# Patient Record
Sex: Female | Born: 1985
Health system: Southern US, Community
[De-identification: ages and names within clinical notes are randomized; demographics above are authoritative.]

## PROBLEM LIST (undated history)

## (undated) DIAGNOSIS — E119 Type 2 diabetes mellitus without complications: Secondary | ICD-10-CM

## (undated) DIAGNOSIS — R519 Headache, unspecified: Secondary | ICD-10-CM

## (undated) DIAGNOSIS — R87629 Unspecified abnormal cytological findings in specimens from vagina: Secondary | ICD-10-CM

## (undated) DIAGNOSIS — C801 Malignant (primary) neoplasm, unspecified: Secondary | ICD-10-CM

## (undated) HISTORY — DX: Unspecified abnormal cytological findings in specimens from vagina: R87.629

## (undated) HISTORY — PX: LEEP: SHX91

## (undated) HISTORY — PX: WISDOM TOOTH EXTRACTION: SHX21

## (undated) HISTORY — DX: Headache, unspecified: R51.9

---
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
## 2006-06-07 ENCOUNTER — Emergency Department (HOSPITAL_COMMUNITY): Admission: EM | Admit: 2006-06-07 | Discharge: 2006-06-08 | Payer: Self-pay | Admitting: Emergency Medicine

## 2006-07-04 ENCOUNTER — Other Ambulatory Visit: Admission: RE | Admit: 2006-07-04 | Discharge: 2006-07-04 | Payer: Self-pay | Admitting: Obstetrics and Gynecology

## 2006-12-30 ENCOUNTER — Ambulatory Visit (HOSPITAL_COMMUNITY): Admission: RE | Admit: 2006-12-30 | Discharge: 2006-12-30 | Payer: Self-pay | Admitting: Obstetrics and Gynecology

## 2007-03-04 ENCOUNTER — Inpatient Hospital Stay (HOSPITAL_COMMUNITY): Admission: AD | Admit: 2007-03-04 | Discharge: 2007-03-07 | Payer: Self-pay | Admitting: Obstetrics and Gynecology

## 2008-08-17 ENCOUNTER — Emergency Department (HOSPITAL_COMMUNITY): Admission: EM | Admit: 2008-08-17 | Discharge: 2008-08-17 | Payer: Self-pay | Admitting: Emergency Medicine

## 2008-08-19 ENCOUNTER — Emergency Department (HOSPITAL_COMMUNITY): Admission: EM | Admit: 2008-08-19 | Discharge: 2008-08-19 | Payer: Self-pay | Admitting: Emergency Medicine

## 2009-03-15 ENCOUNTER — Emergency Department (HOSPITAL_COMMUNITY): Admission: EM | Admit: 2009-03-15 | Discharge: 2009-03-15 | Payer: Self-pay | Admitting: Emergency Medicine

## 2010-05-13 IMAGING — CR DG CHEST 2V
2 series · 2 of 2 positions shown · non-contrast
Comparison: None

CLINICAL DATA: Sore throat and cough

CHEST - 2 VIEW

[w chest pa]
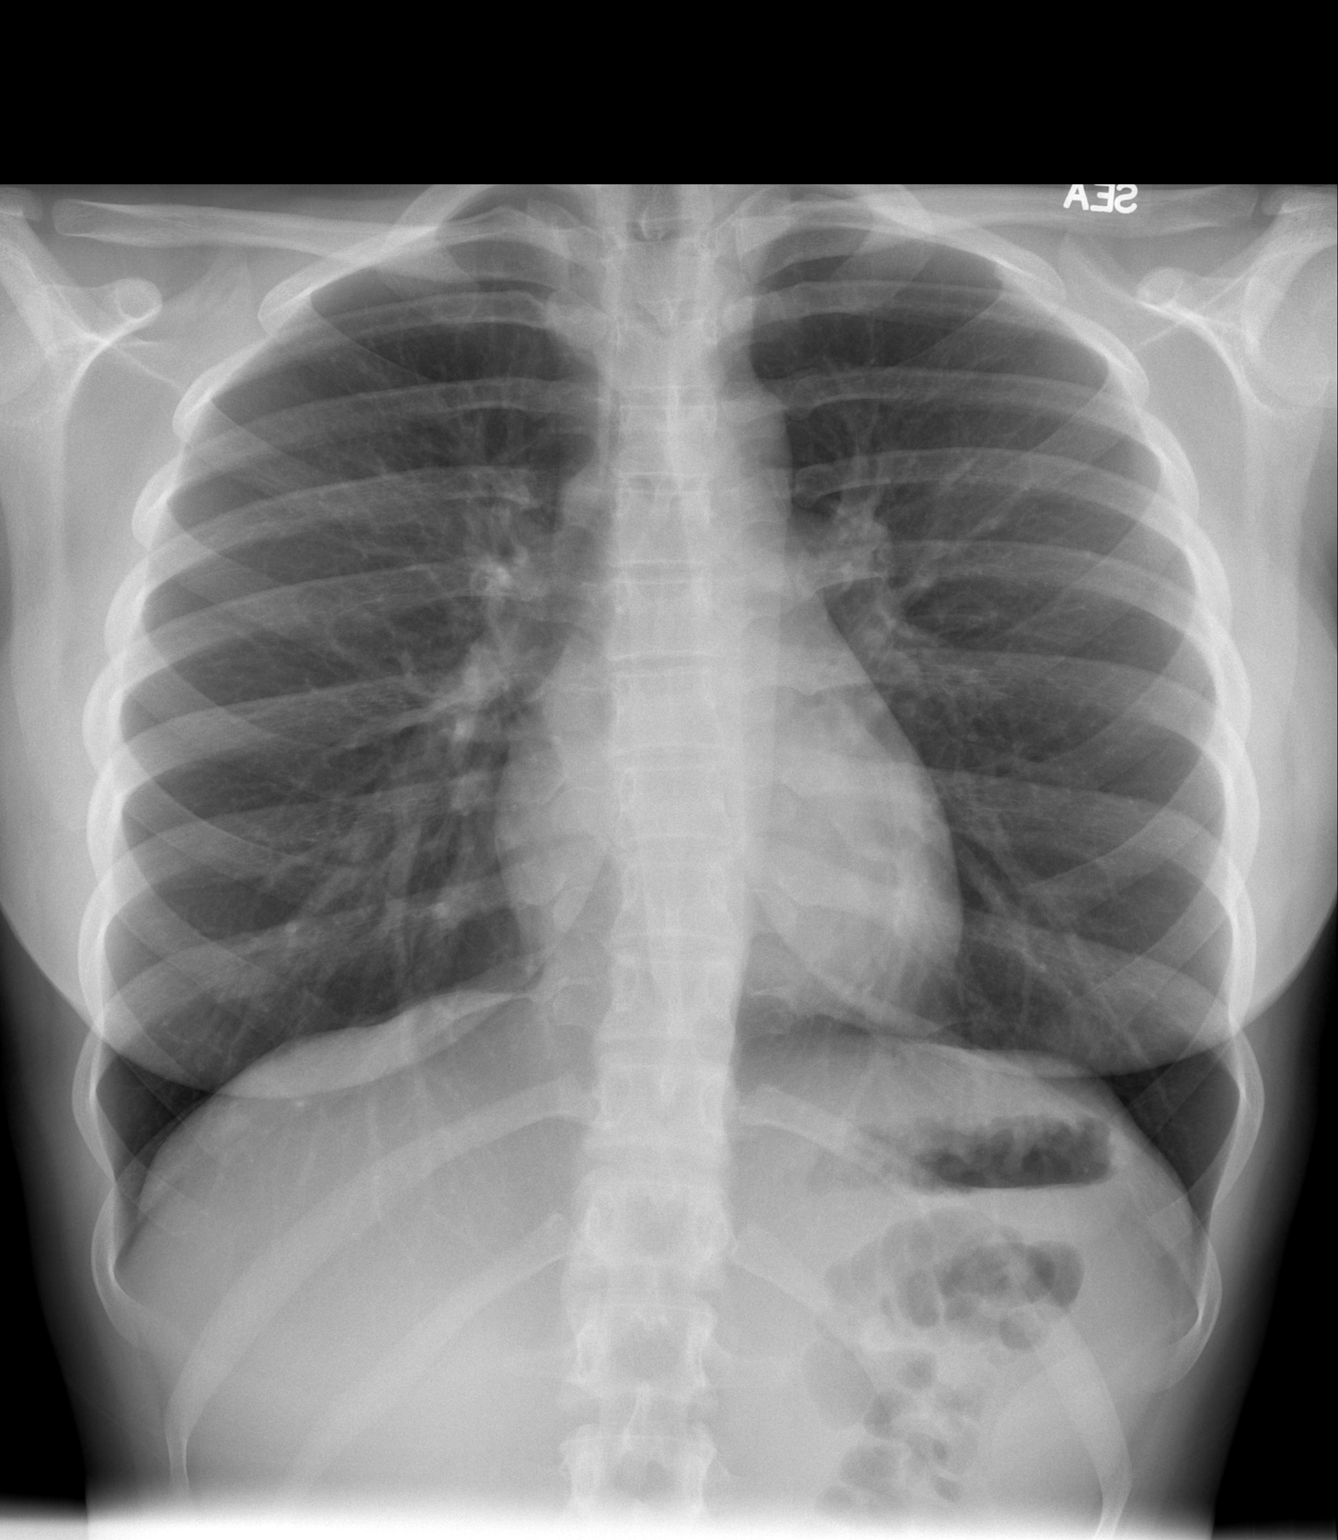

[w chest lat]
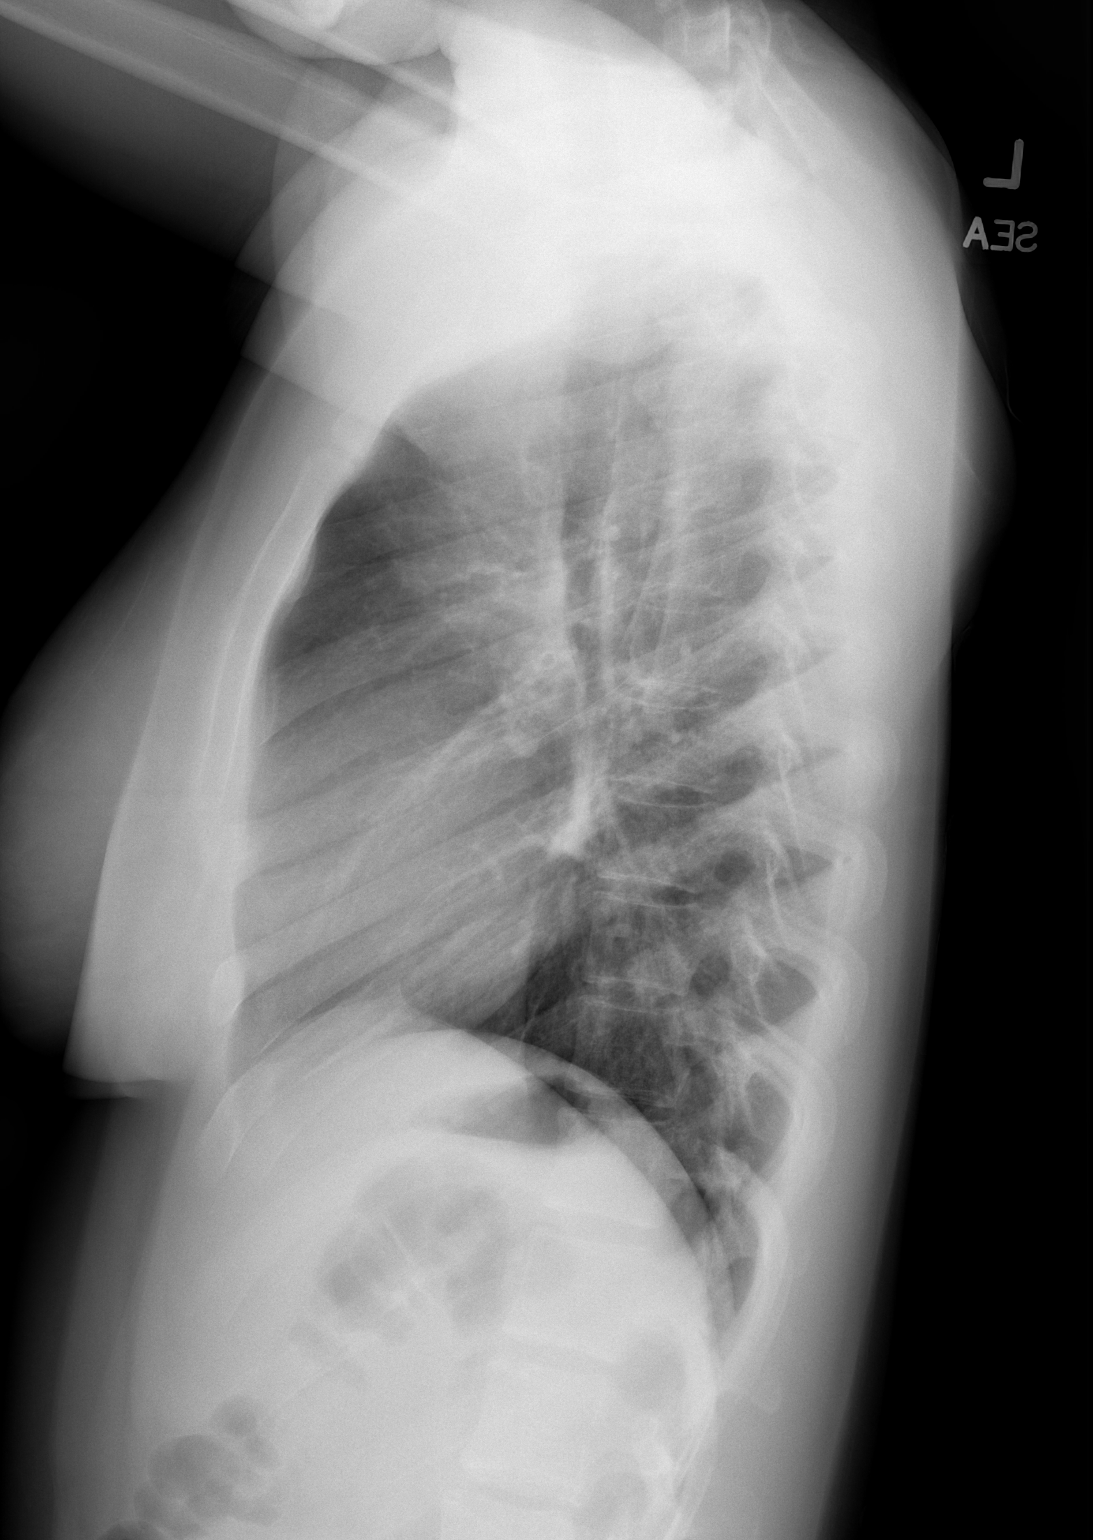

[2 of 2 positions shown; findings below may reference images not displayed]

FINDINGS: Heart and mediastinal contours are normal.  Both lungs
are well expanded and clear.  No adenopathy or pleural fluid is
identified.  Osseous structures unremarkable.
IMPRESSION: No acute findings in the chest.

## 2011-03-03 LAB — STREP A DNA PROBE

## 2011-03-03 LAB — RAPID STREP SCREEN (MED CTR MEBANE ONLY): Streptococcus, Group A Screen (Direct): NEGATIVE

## 2011-04-09 NOTE — Discharge Summary (Signed)
Heather Charles, Heather Charles                ACCOUNT NO.:  [id number]   MEDICAL RECORD NO.:  [id number]          PATIENT TYPE:  INP   LOCATION:  9110                          FACILITY:  [facility]   PHYSICIAN:  Sherron Monday, MD        DATE OF BIRTH:  December 24, 1985   DATE OF ADMISSION:  03/04/2007  DATE OF DISCHARGE:  03/07/2007                               DISCHARGE SUMMARY   ADMISSION DIAGNOSIS:  Intrauterine pregnancy at term, early labor.   DISCHARGE DIAGNOSES:  Intrauterine pregnancy at term, early labor,  delivered via spontaneous vaginal delivery.   HISTORY OF PRESENT ILLNESS:  A 25 year old G1 at 37-0 weeks with  decreased fetal movement, no loss of fluid, no vaginal bleeding but  regular painful contractions approximately every 3-5 minutes for the  past several hours. Her prenatal care was complicated by the pregnancy  measuring size less than dates; however, she had normal growth by  ultrasound.  Her EDC is 03/25/07.   PAST MEDICAL HISTORY:  Not significant.   PAST SURGICAL HISTORY:  Significant for LEEP.   OB/GYN HISTORY:  G1 is the present pregnancy with a history of an  abnormal pap smear and a LEEP procedure in the past.  Her pap smears  have been normal since.  Sexually transmitted disease, she has HPV.   MEDICATIONS:  Prenatal vitamins.   ALLERGIES:  No known drug allergies.   SOCIAL HISTORY:  Negative x3. She previously was a smoker but quit with  the pregnancy. She is single.   FAMILY HISTORY:  Significant for coronary artery disease in maternal  grandfather, high blood pressure in paternal grandfather, COPD in the  maternal great aunt and cancer of unknown origin in a maternal great  uncle.   PRENATAL LABORATORY DATA:  Hemoglobin 13.4, platelet count 203,000,  blood type O negative, antibody screen negative, gonorrhea negative,  chlamydia negative, RPR nonreactive, rubella immune.  Cystic fibrosis  negative, hepatitis B surface antigen negative, HIV nonreactive, Glucola  115, group B streptococcus was negative. Ultrasound performed on  11/05/07 giving EDC of 03/25/07 revealed normal anatomy of a female infant,  no posterior placenta.   ADMISSION PHYSICAL EXAMINATION:  VITAL SIGNS:  She is afebrile with  stable vital signs.  GENERAL:  She was uncomfortable, otherwise, had a benign examination.   HOSPITAL COURSE:  Fetal heart tones were in the 150s and reactive.  Contractions were approximately every three minutes.  A sterile vaginal  examination was 2-3, 70 and -2, which had been 1 in the office earlier  in the week. She was admitted in early labor. She was group B  streptococcus negative, so no prophylaxis was necessary.  A spontaneous  vaginal delivery was anticipated. Her labor course was relatively  uncomplicated. She was ruptured for clear fluid, 3-4 cm, 90% effaced and  -1 station with a temperature that was 100.6. She was started on  ampicillin and gentamicin for chorioamnionitis but at this time she is 9  cm, 100% effaced and +1 station. She proceeded on to be complete +1 and  pushed for approximately two hours. She delivered  a viable female infant  at 8:42 a.m. with Apgars of 7 at one minute and 8 at five minutes,  weighed 6 pounds 7 ounces. The infant was delivered OP to ROT.  Placenta  was expressed intact. There was a true knot in the cord, no OB  laceration, EBL was less than 500 cc.  Discussed with the patient  circumcision for the female infant including risks, benefits and  alternatives.  They wished to proceed with this.  Her postpartum course  was uncomplicated. She remained afebrile with vital signs stable  throughout. Her hemoglobin decreased from 12.2 to 10.1.  On postpartum  day two she was discharged to home with routine discharge instructions  including numbers to call with any questions or problems and  prescriptions for Vicodin, Motrin and prenatal vitamins.   DISCHARGE INFORMATION:  She is O negative, rubella immune. Bottle  feeding  and wants to get Mirena for contraception.  We will check her  benefits for this.  The infant was O positive blood type so she will  receive RhoGAM prior to discharge.      Sherron Monday, MD  Electronically Signed     JB/MEDQ  D:  03/07/2007  T:  03/07/2007  Job:  [id number]

## 2012-02-16 ENCOUNTER — Emergency Department (HOSPITAL_COMMUNITY): Payer: Medicaid Other

## 2012-02-16 ENCOUNTER — Emergency Department (HOSPITAL_COMMUNITY)
Admission: EM | Admit: 2012-02-16 | Discharge: 2012-02-16 | Disposition: A | Discharge status: Home / Self Care | Payer: Medicaid Other | Attending: Emergency Medicine | Admitting: Emergency Medicine

## 2012-02-16 ENCOUNTER — Encounter (HOSPITAL_COMMUNITY): Payer: Self-pay | Admitting: *Deleted

## 2012-02-16 DIAGNOSIS — F17200 Nicotine dependence, unspecified, uncomplicated: Secondary | ICD-10-CM | POA: Insufficient documentation

## 2012-02-16 DIAGNOSIS — R059 Cough, unspecified: Secondary | ICD-10-CM | POA: Insufficient documentation

## 2012-02-16 DIAGNOSIS — J40 Bronchitis, not specified as acute or chronic: Secondary | ICD-10-CM

## 2012-02-16 DIAGNOSIS — R071 Chest pain on breathing: Secondary | ICD-10-CM | POA: Insufficient documentation

## 2012-02-16 DIAGNOSIS — Z8541 Personal history of malignant neoplasm of cervix uteri: Secondary | ICD-10-CM | POA: Insufficient documentation

## 2012-02-16 DIAGNOSIS — R05 Cough: Secondary | ICD-10-CM | POA: Insufficient documentation

## 2012-02-16 HISTORY — DX: Malignant (primary) neoplasm, unspecified: C80.1

## 2012-02-16 MED ORDER — HYDROCOD POLST-CHLORPHEN POLST 10-8 MG/5ML PO LQCR: 5.0000 mL | Freq: Two times a day (BID) | ORAL | Status: DC | PRN

## 2012-02-16 MED ORDER — AZITHROMYCIN 250 MG PO TABS: 500.0000 mg | ORAL_TABLET | Freq: Every day | ORAL | Status: AC

## 2012-02-16 NOTE — ED Notes (Signed)
Pt is here with coughing since feburary and sts that right side rib area is hurting with coughing

## 2012-02-16 NOTE — Discharge Instructions (Signed)
Bronchitis Bronchitis is the body's way of reacting to injury and/or infection (inflammation) of the bronchi. Bronchi are the air tubes that extend from the windpipe into the lungs. If the inflammation becomes severe, it may cause shortness of breath. CAUSES  Inflammation may be caused by:  A virus.   Germs (bacteria).   Dust.   Allergens.   Pollutants and many other irritants.  The cells lining the bronchial tree are covered with tiny hairs (cilia). These constantly beat upward, away from the lungs, toward the mouth. This keeps the lungs free of pollutants. When these cells become too irritated and are unable to do their job, mucus begins to develop. This causes the characteristic cough of bronchitis. The cough clears the lungs when the cilia are unable to do their job. Without either of these protective mechanisms, the mucus would settle in the lungs. Then you would develop pneumonia. Smoking is a common cause of bronchitis and can contribute to pneumonia. Stopping this habit is the single most important thing you can do to help yourself. TREATMENT   Your caregiver may prescribe an antibiotic if the cough is caused by bacteria. Also, medicines that open up your airways make it easier to breathe. Your caregiver may also recommend or prescribe an expectorant. It will loosen the mucus to be coughed up. Only take over-the-counter or prescription medicines for pain, discomfort, or fever as directed by your caregiver.   Removing whatever causes the problem (smoking, for example) is critical to preventing the problem from getting worse.   Cough suppressants may be prescribed for relief of cough symptoms.   Inhaled medicines may be prescribed to help with symptoms now and to help prevent problems from returning.   For those with recurrent (chronic) bronchitis, there may be a need for steroid medicines.  SEEK IMMEDIATE MEDICAL CARE IF:   During treatment, you develop more pus-like mucus  (purulent sputum).   You have a fever.   Your baby is older than 3 months with a rectal temperature of 102 F (38.9 C) or higher.   Your baby is 60 months old or younger with a rectal temperature of 100.4 F (38 C) or higher.   You become progressively more ill.   You have increased difficulty breathing, wheezing, or shortness of breath.  It is necessary to seek immediate medical care if you are elderly or sick from any other disease. MAKE SURE YOU:   Understand these instructions.   Will watch your condition.   Will get help right away if you are not doing well or get worse.  Document Released: 11/08/2005 Document Revised: 10/28/2011 Document Reviewed: 09/17/2008 [facility] Patient Information 2012 [place], [place].   RESOURCE GUIDE  Dental Problems  Patients with Medicaid: [facility]                     [phone] [address]                                           Phone:  [phone]                                                  If unable to pay or uninsured, contact:  [facility]  or [facility]. to become qualified for the adult dental clinic.  Chronic Pain Problems Contact [facility]  [phone] Patients need to be referred by their primary care doctor.  Insufficient Money for Medicine Contact United Way:  call "211" or [facility] [phone].  No Primary Care Doctor Call [facility]  [phone] Other agencies that provide inexpensive medical care    [facility]  [phone]    [facility]  [phone]    [facility]  [phone]    [facility]  [phone]    [facility]  8120692337    [facility]  [phone]  Substance Abuse Resources [facility] Services  [phone] [facility] [phone] [facility] [phone] Floydene Flock [phone] Residential & Outpatient Substance Abuse Program  [phone]  Psychological  Services [facility]  [phone] [facility]  [phone] [facility]   [phone] (emergency services [phone])  Abuse/Neglect [facility] Child Abuse Hotline 445-185-0040 [facility] Child Abuse Hotline [phone] (After Hours)  Emergency Shelter [facility] [phone]  Maternity Homes [facility] 717-374-1748 [facility] 4065925309  MRSA Hotline #:   601-232-8315    [facility] Resources  [facility]  [employer]                           [facility] [address] [place]                     [address]         [address]  Blondell Reveal Phone:  [phone]                                  Phone:  [phone]                   Phone:  [phone]  [facility] Phone:  [phone]  [facility] Child Abuse Hotline 7756680212 [phone] (After Hours)

## 2012-02-16 NOTE — ED Provider Notes (Signed)
History     CSN: [id number]  Arrival date & time 02/16/12  1301   First MD Initiated Contact with Patient 02/16/12 1552      Chief Complaint  Patient presents with  . Cough    (Consider location/radiation/quality/duration/timing/severity/associated sxs/prior treatment) HPI  26 year old female presents with a chief complaints of cough. Patient states she has been having a cough with yellow sputum for more than a month. Cough has been gradually getting worse. Initially it was only at night but now it has been throughout the day. Cough minimally improves with NyQuil and with cough medication. She also endorsed chest wall pain from coughing. Pain is to rib cage, more specifically to the right ribs. Denies swelling or rash, denies trauma.  Patient is a smoker but has decreased her usage to about 3 cigarettes a day. Patient denies sneezing, runny nose, sore throat, fever, nausea, vomiting, diarrhea, abdominal pain, or rash. She denies calf tenderness or leg swelling. She uses for Mirena IUD but denies any exogenous hormone, recent surgery, or prolonged bed rest.  Has prior hx of cervical cancer diagnosed >10 yrs ago.  Past Medical History  Diagnosis Date  . Cancer     cevical laser surgery    History reviewed. No pertinent past surgical history.  No family history on file.  History  Substance Use Topics  . Smoking status: Current Everyday Smoker  . Smokeless tobacco: Not on file  . Alcohol Use: No    OB History    Grav Para Term Preterm Abortions TAB SAB Ect Mult Living                  Review of Systems  All other systems reviewed and are negative.    Allergies  Review of patient's allergies indicates no known allergies.  Home Medications  No current outpatient prescriptions on file.  BP 110/73  Pulse 91  Temp(Src) 97.8 F (36.6 C) (Oral)  Resp 18  SpO2 98%  LMP 02/03/2012  Physical Exam  Nursing note and vitals reviewed. Constitutional: She appears  well-developed and well-nourished. No distress.       Awake, alert, nontoxic appearance  HENT:  Head: Atraumatic.  Mouth/Throat: Oropharynx is clear and moist. No oropharyngeal exudate.  Eyes: Conjunctivae are normal. Right eye exhibits no discharge. Left eye exhibits no discharge.  Neck: Neck supple.  Cardiovascular: Normal rate and regular rhythm.   Pulmonary/Chest: Effort normal and breath sounds normal. No respiratory distress. She exhibits no tenderness.  Abdominal: Soft. There is no tenderness. There is no rebound.  Musculoskeletal: She exhibits no tenderness.       ROM appears intact, no obvious focal weakness  Neurological: She is alert.       Mental status and motor strength appears intact  Skin: No rash noted.  Psychiatric: She has a normal mood and affect.    ED Course  Procedures (including critical care time)  Labs Reviewed - No data to display Dg Chest 2 View  02/16/2012  *RADIOLOGY REPORT*  Clinical Data: Cough.  CHEST - 2 VIEW  Comparison: 03/15/2009.  Findings: The cardiac silhouette, mediastinal and hilar contours are within normal limits and stable. The lungs are clear. Mild peribronchial thickening may suggest bronchitis or changes related to smoking.  No pleural effusions.  The bony thorax is intact.  IMPRESSION:  1.  Mild peribronchial thickening could be due to bronchitis or smoking. 2.  No infiltrates or effusions.  Original Report Authenticated By: P. Loralie Champagne, M.D.  Dg Ribs Unilateral W/chest Right  02/16/2012  *RADIOLOGY REPORT*  Clinical Data: Cough.  Right rib pain.  RIGHT RIBS AND CHEST - 3+ VIEW  Comparison: Chest x-ray 02/16/2012.  Findings: The cardiac silhouette, mediastinal and hilar contours are normal.  The right lung is clear.  No acute rib abnormality. No pneumothorax or pleural thickening.  IMPRESSION: No acute rib abnormality.  Original Report Authenticated By: P. Loralie Champagne, M.D.     No diagnosis found.    MDM  Persisting cough,  in a smoker. Chest x-ray shows some evidence suggestive of bronchitis. Due to the duration, shoulder prescribed Z-Pak, and cough medication. I have low suspicions for PE. Patient is in no acute distress with stable vital signs, and is afebrile. Smoking cessation discussed.        Fayrene Helper, PA-C 02/16/12 1605

## 2012-02-23 NOTE — ED Provider Notes (Signed)
Medical screening examination/treatment/procedure(s) were performed by non-physician practitioner and as supervising physician I was immediately available for consultation/collaboration.  Doreen Garretson, MD 02/23/12 2348 

## 2016-10-21 ENCOUNTER — Emergency Department (HOSPITAL_BASED_OUTPATIENT_CLINIC_OR_DEPARTMENT_OTHER)
Admission: EM | Admit: 2016-10-21 | Discharge: 2016-10-21 | Disposition: A | Discharge status: Home / Self Care | Payer: Medicaid Other | Attending: Physician Assistant | Admitting: Physician Assistant

## 2016-10-21 ENCOUNTER — Encounter (HOSPITAL_BASED_OUTPATIENT_CLINIC_OR_DEPARTMENT_OTHER): Payer: Self-pay | Admitting: *Deleted

## 2016-10-21 DIAGNOSIS — Y92410 Unspecified street and highway as the place of occurrence of the external cause: Secondary | ICD-10-CM | POA: Insufficient documentation

## 2016-10-21 DIAGNOSIS — Y999 Unspecified external cause status: Secondary | ICD-10-CM | POA: Insufficient documentation

## 2016-10-21 DIAGNOSIS — Y939 Activity, unspecified: Secondary | ICD-10-CM | POA: Insufficient documentation

## 2016-10-21 DIAGNOSIS — S39012A Strain of muscle, fascia and tendon of lower back, initial encounter: Secondary | ICD-10-CM | POA: Insufficient documentation

## 2016-10-21 DIAGNOSIS — F17200 Nicotine dependence, unspecified, uncomplicated: Secondary | ICD-10-CM | POA: Insufficient documentation

## 2016-10-21 DIAGNOSIS — Z859 Personal history of malignant neoplasm, unspecified: Secondary | ICD-10-CM | POA: Insufficient documentation

## 2016-10-21 DIAGNOSIS — S060X0A Concussion without loss of consciousness, initial encounter: Secondary | ICD-10-CM | POA: Insufficient documentation

## 2016-10-21 DIAGNOSIS — S161XXA Strain of muscle, fascia and tendon at neck level, initial encounter: Secondary | ICD-10-CM | POA: Insufficient documentation

## 2016-10-21 MED ORDER — CYCLOBENZAPRINE HCL 10 MG PO TABS: 10.0000 mg | ORAL_TABLET | Freq: Two times a day (BID) | ORAL | 0 refills | Status: DC | PRN

## 2016-10-21 MED ORDER — HYDROCODONE-ACETAMINOPHEN 5-325 MG PO TABS: 1.0000 | ORAL_TABLET | Freq: Four times a day (QID) | ORAL | 0 refills | Status: DC | PRN

## 2016-10-21 MED FILL — CYCLOBENZAPRINE 10 MG TAB: 10 | 10 days supply | Qty: 20 | Fill #0

## 2016-10-21 MED FILL — HYDROCODON-APAP 5-325: 5-325 | 1 days supply | Qty: 6 | Fill #0

## 2016-10-21 NOTE — ED Notes (Signed)
Signature box is not active. Written and verbal instructions given. She does not have questions. Will return for Rx after she gets cash.
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
## 2018-05-22 ENCOUNTER — Encounter: Payer: Self-pay | Admitting: Neurology

## 2018-07-18 NOTE — Progress Notes (Signed)
NEUROLOGY CONSULTATION NOTE  Heather Charles MRN: [id number] DOB: May 08, 1986  Referring provider: Maude Leriche, PA Primary care provider: Maude Leriche, PA  Reason for consult:  headache  HISTORY OF PRESENT ILLNESS: Heather Charles is a 33 year old right-handed female who presents for headache.  History supplemented by referring provider's note.  Onset:  Since childhood-adolescence Location:  Varies:  Left sided face/head, skullcap, occipital, diffuse Quality:  Pressure, sometimes throbbing Intensity:  Moderate to severe.  She denies new headache, thunderclap headache or severe headache that wakes her from sleep. Aura:  no Prodrome:  no Postdrome: no Associated symptoms:  Blurred vision, nausea, photophobia, some phonophobia.  She denies associated unilateral numbness or weakness. Duration:  When treated, it resolves for 2 hours and returns.  Usually several hours 3 times daily Frequency:  daily Frequency of abortive medication: ibuprofen almost daily, Equate Migraine once every 2 weeks Triggers:  No Exacerbating factors:  Light, computer screen, cell phone screen, sunlight Relieving factors:  Applying pressure to the pain Activity:  unsure  Current NSAIDS:  Ibuprofen 400mg  Current analgesics:  Acetaminophen 250mg -ASA 250mg -caffeine 65mg  Current triptans:  no Current ergotamine:  no Current anti-emetic:  no Current muscle relaxants:  no Current anti-anxiolytic:  no Current sleep aide:  no Current Antihypertensive medications:  no Current Antidepressant medications:  no Current Anticonvulsant medications:  no Current anti-CGRP:  no Current Vitamins/Herbal/Supplements:  no Current Antihistamines/Decongestants:  no Other therapy:  no Hormone/Birth Control:  Nexplanon  Past NSAIDS:  Aleve Past analgesics:  Tylenol Past abortive triptans:  no Past abortive ergotamine:  no Past muscle relaxants:  no Past anti-emetic:  no Past antihypertensive medications:   no Past antidepressant medications:  no Past anticonvulsant medications:  no Past anti-CGRP:  no Past vitamins/Herbal/Supplements:  no Past antihistamines/decongestants:  no Other past therapies:  no  Caffeine:  no Alcohol:  On occasion Smoker:  no Diet:  Drinks 8 16 oz bottles water daily Exercise:  Walks frequently but not specifically routine exercise Depression:  no; Anxiety:  maybe Other pain:  Back pain Sleep hygiene:  7.5 to 8 hours Family history of headache:  Mother, grandmother  05/19/18 LABS:  CBC with WBC 10.7, HGB 14.2, HCT 41.8, PLT 239; CMP with Na 138, K 4.3, glucose 85, BUN 14, Cr 0.82, t bili 0.4, ALP 60, AST 32, ALT 38.  PAST MEDICAL HISTORY: Past Medical History:  Diagnosis Date  . Cancer [facility])    cevical laser surgery    PAST SURGICAL HISTORY: LEEP  MEDICATIONS: Current Outpatient Medications on File Prior to Visit  Medication Sig Dispense Refill  . chlorpheniramine-HYDROcodone (TUSSIONEX PENNKINETIC ER) 10-8 MG/5ML LQCR Take 5 mLs by mouth every 12 (twelve) hours as needed. 140 mL 0  . cyclobenzaprine (FLEXERIL) 10 MG tablet Take 1 tablet (10 mg total) by mouth 2 (two) times daily as needed for muscle spasms. 20 tablet 0  . HYDROcodone-acetaminophen (NORCO/VICODIN) 5-325 MG tablet Take 1-2 tablets by mouth every 6 (six) hours as needed. 6 tablet 0   No current facility-administered medications on file prior to visit.     ALLERGIES: No Known Allergies  FAMILY HISTORY: Paternal grandfather:  MI, HTN Paternal Grandmother:  MI, HTN, diabetes, breast cancer Maternal grandfather:  MI, HTN Great grandmother:  Dementia  SOCIAL HISTORY: Social History   Socioeconomic History  . Marital status: Single    Spouse name: Not on file  . Number of children: Not on file  . Years of education: Not on file  .  Highest education level: Not on file  Occupational History  . Not on file  Social Needs  . Financial resource strain: Not on file  . Food  insecurity:    Worry: Not on file    Inability: Not on file  . Transportation needs:    Medical: Not on file    Non-medical: Not on file  Tobacco Use  . Smoking status: Current Every Day Smoker  . Smokeless tobacco: Never Used  Substance and Sexual Activity  . Alcohol use: No  . Drug use: No  . Sexual activity: Not on file  Lifestyle  . Physical activity:    Days per week: Not on file    Minutes per session: Not on file  . Stress: Not on file  Relationships  . Social connections:    Talks on phone: Not on file    Gets together: Not on file    Attends religious service: Not on file    Active member of club or organization: Not on file    Attends meetings of clubs or organizations: Not on file    Relationship status: Not on file  . Intimate partner violence:    Fear of current or ex partner: Not on file    Emotionally abused: Not on file    Physically abused: Not on file    Forced sexual activity: Not on file  Other Topics Concern  . Not on file  Social History Narrative  . Not on file    REVIEW OF SYSTEMS: Constitutional: No fevers, chills, or sweats, no generalized fatigue, change in appetite Eyes: No visual changes, double vision, eye pain Ear, nose and throat: No hearing loss, ear pain, nasal congestion, sore throat Cardiovascular: No chest pain, palpitations Respiratory:  No shortness of breath at rest or with exertion, wheezes GastrointestinaI: No nausea, vomiting, diarrhea, abdominal pain, fecal incontinence Genitourinary:  No dysuria, urinary retention or frequency Musculoskeletal:  No neck pain, back pain Integumentary: No rash, pruritus, skin lesions Neurological: as above Psychiatric: No depression, insomnia, anxiety Endocrine: No palpitations, fatigue, diaphoresis, mood swings, change in appetite, change in weight, increased thirst Hematologic/Lymphatic:  No purpura, petechiae. Allergic/Immunologic: no itchy/runny eyes, nasal congestion, recent allergic  reactions, rashes  PHYSICAL EXAM: Blood pressure 102/70, pulse 66, temperature 98.3 F (36.8 C), height 5' 0.25" (1.53 m), weight 143 lb (64.9 kg). General: No acute distress.  Patient appears well-groomed.   Head:  Normocephalic/atraumatic Eyes:  fundi examined but not visualized Neck: supple, no paraspinal tenderness, full range of motion Back: No paraspinal tenderness Heart: regular rate and rhythm Lungs: Clear to auscultation bilaterally. Vascular: No carotid bruits. Neurological Exam: Mental status: alert and oriented to person, place, and time, recent and remote memory intact, fund of knowledge intact, attention and concentration intact, speech fluent and not dysarthric, language intact. Cranial nerves: CN I: not tested CN II: pupils equal, round and reactive to light, visual fields intact CN III, IV, VI:  full range of motion, no nystagmus, no ptosis CN V: facial sensation intact CN VII: upper and lower face symmetric CN VIII: hearing intact CN IX, X: gag intact, uvula midline CN XI: sternocleidomastoid and trapezius muscles intact CN XII: tongue midline Bulk & Tone: normal, no fasciculations. Motor:  5/5 throughout  Sensation:  temperature and vibration sensation intact.  Deep Tendon Reflexes:  2+ throughout, toes downgoing.  Finger to nose testing:  Without dysmetria.   Heel to shin:  Without dysmetria.   Gait:  Normal station and stride.  Able  to turn and tandem walk. Romberg negative.  IMPRESSION: Chronic migraine without aura, without status migrainosus, not intractable Medication overuse headahce  PLAN: 1.  Start topiramate 25mg  at bedtime x1 week then increase to 50mg  at bedtime. Contact me in 5 weeks with update and we can adjust dose if needed.  Advised to take precautions not to get pregnant due to teratogenic side effects. 2.  Sumatriptan 100mg  for abortive therapy,  May repeat dose once in 2 hours if needed.  Do not exceed two tablets in 24 hours. 3.  STOP  IBUPROFEN.  Limit use of pain relievers to no more than 2 days out of the week.  These medications include acetaminophen, ibuprofen, triptans and narcotics.  This will help reduce risk of rebound headaches. 4.  Be aware of common food triggers such as processed sweets, processed foods with nitrites (such as deli meat, hot dogs, sausages), foods with MSG, alcohol (such as wine), chocolate, certain cheeses, certain fruits (dried fruits, bananas, some citrus fruit), vinegar, diet soda. 4.  Avoid caffeine 5.  Routine exercise 6.  Proper sleep hygiene 7.  Stay adequately hydrated with water 8.  Keep a headache diary. 9.  Maintain proper stress management. 10.  Do not skip meals. 11.  Consider supplements:  Magnesium citrate 400mg  to 600mg  daily, riboflavin 400mg , Coenzyme Q 10 100mg  three times daily 12.  Follow up in 4 months  Thank you for allowing me to take part in the care of this patient.  Metta Clines, DO  CC: Maude Leriche, PA

## 2018-07-19 ENCOUNTER — Ambulatory Visit (INDEPENDENT_AMBULATORY_CARE_PROVIDER_SITE_OTHER): Payer: No Typology Code available for payment source | Admitting: Neurology

## 2018-07-19 ENCOUNTER — Encounter: Payer: Self-pay | Admitting: Neurology

## 2018-07-19 VITALS — BP 102/70 | HR 66 | Temp 98.3°F | Ht 60.25 in | Wt 143.0 lb

## 2018-07-19 DIAGNOSIS — G4440 Drug-induced headache, not elsewhere classified, not intractable: Secondary | ICD-10-CM | POA: Diagnosis not present

## 2018-07-19 DIAGNOSIS — G43709 Chronic migraine without aura, not intractable, without status migrainosus: Secondary | ICD-10-CM

## 2018-07-19 MED ORDER — TOPIRAMATE 50 MG PO TABS: ORAL_TABLET | ORAL | 0 refills | Status: DC

## 2018-07-19 MED ORDER — SUMATRIPTAN SUCCINATE 100 MG PO TABS: ORAL_TABLET | ORAL | 3 refills | Status: DC

## 2018-07-19 NOTE — Patient Instructions (Signed)
Migraine Recommendations: 1.  Start topiramate 50mg  tablet.  Take 1/2 tablet at bedtime for 7 days, then increase to 1 tablet at bedtime.  Contact me in 5 weeks with update and we can adjust dose if needed. 2.  Take sumatriptan 100mg  at earliest onset of headache.  May repeat dose once in 2 hours if needed.  Do not exceed two tablets in 24 hours. 3.  STOP IBUPROFEN.  Limit use of pain relievers to no more than 2 days out of the week.  These medications include acetaminophen, ibuprofen, triptans and narcotics.  This will help reduce risk of rebound headaches. 4.  Be aware of common food triggers such as processed sweets, processed foods with nitrites (such as deli meat, hot dogs, sausages), foods with MSG, alcohol (such as wine), chocolate, certain cheeses, certain fruits (dried fruits, bananas, some citrus fruit), vinegar, diet soda. 4.  Avoid caffeine 5.  Routine exercise 6.  Proper sleep hygiene 7.  Stay adequately hydrated with water 8.  Keep a headache diary. 9.  Maintain proper stress management. 10.  Do not skip meals. 11.  Consider supplements:  Magnesium citrate 400mg  to 600mg  daily, riboflavin 400mg , Coenzyme Q 10 100mg  three times daily 12.  Follow up in 4 months

## 2018-11-30 ENCOUNTER — Ambulatory Visit: Payer: No Typology Code available for payment source | Admitting: Neurology

## 2019-07-02 NOTE — Progress Notes (Deleted)
NEUROLOGY FOLLOW UP OFFICE NOTE  YOLANI VO [id number]  HISTORY OF PRESENT ILLNESS: Heather Charles is a 33 year old right-handed female who follows up for migraines.  UPDATE: Last seen in initial consultation on 07/19/18.  At that time, she was started on topiramate ***  Intensity:  *** Duration:  *** Frequency:  *** Frequency of abortive medication: *** Current NSAIDS:  Ibuprofen 400mg  Current analgesics:  Acetaminophen 250mg -ASA 250mg -caffeine 65mg  Current triptans:  no Current ergotamine:  no Current anti-emetic:  no Current muscle relaxants:  no Current anti-anxiolytic:  no Current sleep aide:  no Current Antihypertensive medications:  no Current Antidepressant medications:  no Current Anticonvulsant medications:  topiramate 50mg  Current anti-CGRP:  no Current Vitamins/Herbal/Supplements:  no Current Antihistamines/Decongestants:  no Other therapy:  no Hormone/Birth Control:  Nexplanon  Caffeine:  no Alcohol:  On occasion Smoker:  no Diet:  Drinks 8 16 oz bottles water daily Exercise:  Walks frequently but not specifically routine exercise Depression:  no; Anxiety:  maybe Other pain:  Back pain Sleep hygiene:  7.5 to 8 hours  HISTORY: Onset:  Childhood-adolescence Location:  Varies:  Left sided face/head, skullcap, occipital, diffuse Quality:  Pressure, sometimes throbbing Initial intensity:  Moderate to severe.  She denies new headache, thunderclap headache or severe headache that wakes her from sleep. Aura:  no Prodrome:  no Postdrome: no Associated symptoms:  Blurred vision, nausea, photophobia, some phonophobia.  She denies associated unilateral numbness or weakness. Initial Duration:  When treated, it resolves for 2 hours and returns.  Usually several hours 3 times daily Initial Frequency:  daily Initial Frequency of abortive medication: ibuprofen almost daily, Equate Migraine once every 2 weeks Triggers:  None Exacerbating factors:  Light,  computer screen, cell phone screen, sunlight Relieving factors:  Applying pressure to the pain. Activity:  unsure  Past NSAIDS:  Aleve Past analgesics:  Tylenol Past abortive triptans:  no Past abortive ergotamine:  no Past muscle relaxants:  no Past anti-emetic:  no Past antihypertensive medications:  no Past antidepressant medications:  no Past anticonvulsant medications:  no Past anti-CGRP:  no Past vitamins/Herbal/Supplements:  no Past antihistamines/decongestants:  no Other past therapies:  no  Family history of headache:  Mother, grandmother  PAST MEDICAL HISTORY: Past Medical History:  Diagnosis Date  . Cancer [facility])    cevical laser surgery    MEDICATIONS: Current Outpatient Medications on File Prior to Visit  Medication Sig Dispense Refill  . chlorpheniramine-HYDROcodone (TUSSIONEX PENNKINETIC ER) 10-8 MG/5ML LQCR Take 5 mLs by mouth every 12 (twelve) hours as needed. 140 mL 0  . cyclobenzaprine (FLEXERIL) 10 MG tablet Take 1 tablet (10 mg total) by mouth 2 (two) times daily as needed for muscle spasms. 20 tablet 0  . HYDROcodone-acetaminophen (NORCO/VICODIN) 5-325 MG tablet Take 1-2 tablets by mouth every 6 (six) hours as needed. 6 tablet 0  . SUMAtriptan (IMITREX) 100 MG tablet Take 1 tablet earliest onset of migraine.  May repeat once in 2 hours if headache persists or recurs.  Do not exceed 2 tablets in 24h 10 tablet 3  . topiramate (TOPAMAX) 50 MG tablet Take 0.5 tablet at bedtime for 7 days, then increase to 1 tablet at bedtime 30 tablet 0   No current facility-administered medications on file prior to visit.     ALLERGIES: No Known Allergies  FAMILY HISTORY: Family History  Problem Relation Age of Onset  . Other Mother        hx of migraines  . Post-traumatic stress  disorder Father   . Depression Father    ***.  SOCIAL HISTORY: Social History   Socioeconomic History  . Marital status: Single    Spouse name: Not on file  . Number of children: 1   . Years of education: Not on file  . Highest education level: Associate degree: academic program  Occupational History  . Occupation: Web designer  Social Needs  . Financial resource strain: Not on file  . Food insecurity    Worry: Not on file    Inability: Not on file  . Transportation needs    Medical: Not on file    Non-medical: Not on file  Tobacco Use  . Smoking status: Former Smoker    Types: Cigarettes    Quit date: 2011    Years since quitting: 9.6  . Smokeless tobacco: Never Used  Substance and Sexual Activity  . Alcohol use: Yes    Alcohol/week: 1.0 standard drinks    Types: 1 Shots of liquor per week    Comment: 1-2 x month  . Drug use: No  . Sexual activity: Not on file  Lifestyle  . Physical activity    Days per week: Not on file    Minutes per session: Not on file  . Stress: Not on file  Relationships  . Social Herbalist on phone: Not on file    Gets together: Not on file    Attends religious service: Not on file    Active member of club or organization: Not on file    Attends meetings of clubs or organizations: Not on file    Relationship status: Not on file  . Intimate partner violence    Fear of current or ex partner: Not on file    Emotionally abused: Not on file    Physically abused: Not on file    Forced sexual activity: Not on file  Other Topics Concern  . Not on file  Social History Narrative   Patient is right-handed. She lives with her mother in a one level house. She avoids caffeine. She walks daily.    REVIEW OF SYSTEMS: Constitutional: No fevers, chills, or sweats, no generalized fatigue, change in appetite Eyes: No visual changes, double vision, eye pain Ear, nose and throat: No hearing loss, ear pain, nasal congestion, sore throat Cardiovascular: No chest pain, palpitations Respiratory:  No shortness of breath at rest or with exertion, wheezes GastrointestinaI: No nausea, vomiting, diarrhea, abdominal pain,  fecal incontinence Genitourinary:  No dysuria, urinary retention or frequency Musculoskeletal:  No neck pain, back pain Integumentary: No rash, pruritus, skin lesions Neurological: as above Psychiatric: No depression, insomnia, anxiety Endocrine: No palpitations, fatigue, diaphoresis, mood swings, change in appetite, change in weight, increased thirst Hematologic/Lymphatic:  No purpura, petechiae. Allergic/Immunologic: no itchy/runny eyes, nasal congestion, recent allergic reactions, rashes  PHYSICAL EXAM: *** General: No acute distress.  Patient appears ***-groomed.   Head:  Normocephalic/atraumatic Eyes:  Fundi examined but not visualized Neck: supple, no paraspinal tenderness, full range of motion Heart:  Regular rate and rhythm Lungs:  Clear to auscultation bilaterally Back: No paraspinal tenderness Neurological Exam: alert and oriented to person, place, and time. Attention span and concentration intact, recent and remote memory intact, fund of knowledge intact.  Speech fluent and not dysarthric, language intact.  CN II-XII intact. Bulk and tone normal, muscle strength 5/5 throughout.  Sensation to light touch, temperature and vibration intact.  Deep tendon reflexes 2+ throughout, toes downgoing.  Finger to nose  and heel to shin testing intact.  Gait normal, Romberg negative.  IMPRESSION: ***  PLAN: ***  Metta Clines, DO  CC: ***

## 2019-07-03 ENCOUNTER — Ambulatory Visit: Payer: No Typology Code available for payment source | Admitting: Neurology

## 2019-08-13 ENCOUNTER — Emergency Department (HOSPITAL_COMMUNITY)
Admission: EM | Admit: 2019-08-13 | Discharge: 2019-08-13 | Disposition: A | Discharge status: Home / Self Care | Payer: No Typology Code available for payment source | Attending: Emergency Medicine | Admitting: Emergency Medicine

## 2019-08-13 ENCOUNTER — Encounter (HOSPITAL_COMMUNITY): Payer: Self-pay

## 2019-08-13 ENCOUNTER — Emergency Department (HOSPITAL_COMMUNITY): Payer: No Typology Code available for payment source

## 2019-08-13 ENCOUNTER — Other Ambulatory Visit: Payer: Self-pay

## 2019-08-13 DIAGNOSIS — S91211A Laceration without foreign body of right great toe with damage to nail, initial encounter: Secondary | ICD-10-CM | POA: Insufficient documentation

## 2019-08-13 DIAGNOSIS — Z79899 Other long term (current) drug therapy: Secondary | ICD-10-CM | POA: Insufficient documentation

## 2019-08-13 DIAGNOSIS — W25XXXA Contact with sharp glass, initial encounter: Secondary | ICD-10-CM | POA: Insufficient documentation

## 2019-08-13 DIAGNOSIS — Y92002 Bathroom of unspecified non-institutional (private) residence single-family (private) house as the place of occurrence of the external cause: Secondary | ICD-10-CM | POA: Diagnosis not present

## 2019-08-13 DIAGNOSIS — Y93E1 Activity, personal bathing and showering: Secondary | ICD-10-CM | POA: Insufficient documentation

## 2019-08-13 DIAGNOSIS — Z87891 Personal history of nicotine dependence: Secondary | ICD-10-CM | POA: Diagnosis not present

## 2019-08-13 DIAGNOSIS — S92424B Nondisplaced fracture of distal phalanx of right great toe, initial encounter for open fracture: Secondary | ICD-10-CM | POA: Insufficient documentation

## 2019-08-13 DIAGNOSIS — Y999 Unspecified external cause status: Secondary | ICD-10-CM | POA: Insufficient documentation

## 2019-08-13 DIAGNOSIS — S91219A Laceration without foreign body of unspecified toe(s) with damage to nail, initial encounter: Secondary | ICD-10-CM

## 2019-08-13 MED ORDER — ONDANSETRON 4 MG PO TBDP
4.0000 mg | ORAL_TABLET | Freq: Once | ORAL | Status: AC
  Administered 2019-08-13: 06:00:00 4 mg via ORAL
  Filled 2019-08-13: qty 1

## 2019-08-13 MED ORDER — DOXYCYCLINE HYCLATE 100 MG PO CAPS: 100.0000 mg | ORAL_CAPSULE | Freq: Two times a day (BID) | ORAL | 0 refills | Status: DC

## 2019-08-13 MED ORDER — LIDOCAINE HCL (PF) 1 % IJ SOLN
5.0000 mL | Freq: Once | INTRAMUSCULAR | Status: AC
  Administered 2019-08-13: 5 mL
  Filled 2019-08-13: qty 5

## 2019-08-13 MED ORDER — TETANUS-DIPHTH-ACELL PERTUSSIS 5-2.5-18.5 LF-MCG/0.5 IM SUSP
0.5000 mL | Freq: Once | INTRAMUSCULAR | Status: DC
  Filled 2019-08-13: qty 0.5

## 2019-08-13 MED ORDER — OXYCODONE HCL 5 MG PO TABS
10.0000 mg | ORAL_TABLET | Freq: Once | ORAL | Status: AC
  Administered 2019-08-13: 10 mg via ORAL
  Filled 2019-08-13: qty 2

## 2019-08-13 NOTE — Discharge Instructions (Addendum)
1. Medications: Tylenol or ibuprofen for pain, usual home medications 2. Treatment: ice for swelling, keep wound clean with warm soap and water and keep bandage dry, do not submerge in water for 24 hours 3. Follow Up: Please follow-up with Dr. Amalia Hailey in 2-3 days.  Return to the emergency department for increased redness, drainage of pus from the wound   WOUND CARE  Keep area clean and dry for 24 hours. Do not remove bandage, if applied.  After 24 hours, remove bandage and wash wound gently with mild soap and warm water. Reapply a new bandage after cleaning wound, if directed.   Continue daily cleansing with soap and water until stitches/staples are removed.  Do not apply any ointments or creams to the wound while stitches/staples are in place, as this may cause delayed healing. Return if you experience any of the following signs of infection: Swelling, redness, pus drainage, streaking, fever >101.0 F  Return if you experience excessive bleeding that does not stop after 15-20 minutes of constant, firm pressure.

## 2019-08-13 NOTE — ED Triage Notes (Signed)
Onset tonight pt went to get in shower d/t shoulder pain and shower door fell off, landing on right great.  Toe nail is almost off and site oozing blood.

## 2019-08-13 NOTE — ED Provider Notes (Signed)
[place] EMERGENCY DEPARTMENT Provider Note   CSN: [id number] Arrival date & time: 08/13/19  [id number]     History   Chief Complaint Chief Complaint  Patient presents with  . Toe Injury    HPI Heather Charles is a 33 y.o. female presents to the Emergency Department complaining of acute, persistent right great toe pain onset approximately 2 AM.  Patient reports she was in the shower and the glass shower door came off its track.  She reports that the corner of the door fell onto her right great toe tearing her toenail off and creating significant bleeding.  She reports pain is a 10/10.  She denies numbness or tingling in the toe.  No treatments prior to arrival.  Nothing makes her symptoms better or worse.  Last tetanus April 2018.     The history is provided by the patient and medical records. No language interpreter was used.    Past Medical History:  Diagnosis Date  . Cancer [facility])    cevical laser surgery    There are no active problems to display for this patient.   History reviewed. No pertinent surgical history.   OB History   No obstetric history on file.      Home Medications    Prior to Admission medications   Medication Sig Start Date End Date Taking? Authorizing Provider  chlorpheniramine-HYDROcodone (TUSSIONEX PENNKINETIC ER) 10-8 MG/5ML LQCR Take 5 mLs by mouth every 12 (twelve) hours as needed. 02/16/12   Domenic Moras, PA-C  cyclobenzaprine (FLEXERIL) 10 MG tablet Take 1 tablet (10 mg total) by mouth 2 (two) times daily as needed for muscle spasms. 10/21/16   Montine Circle, PA-C  doxycycline (VIBRAMYCIN) 100 MG capsule Take 1 capsule (100 mg total) by mouth 2 (two) times daily. 08/13/19   Zeta Bucy, Jarrett Soho, PA-C  HYDROcodone-acetaminophen (NORCO/VICODIN) 5-325 MG tablet Take 1-2 tablets by mouth every 6 (six) hours as needed. 10/21/16   Montine Circle, PA-C  SUMAtriptan (IMITREX) 100 MG tablet Take 1 tablet earliest onset of migraine.   May repeat once in 2 hours if headache persists or recurs.  Do not exceed 2 tablets in 24h 07/19/18   Pieter Partridge, DO  topiramate (TOPAMAX) 50 MG tablet Take 0.5 tablet at bedtime for 7 days, then increase to 1 tablet at bedtime 07/19/18   Pieter Partridge, DO    Family History Family History  Problem Relation Age of Onset  . Other Mother        hx of migraines  . Post-traumatic stress disorder Father   . Depression Father     Social History Social History   Tobacco Use  . Smoking status: Former Smoker    Types: Cigarettes    Quit date: 2011    Years since quitting: 9.7  . Smokeless tobacco: Never Used  Substance Use Topics  . Alcohol use: Yes    Alcohol/week: 1.0 standard drinks    Types: 1 Shots of liquor per week    Comment: 1-2 x month  . Drug use: No     Allergies   Patient has no known allergies.   Review of Systems Review of Systems  Constitutional: Negative for fever.  Gastrointestinal: Negative for nausea and vomiting.  Skin: Positive for wound.  Allergic/Immunologic: Negative for immunocompromised state.  Neurological: Negative for weakness and numbness.  Hematological: Does not bruise/bleed easily.  Psychiatric/Behavioral: The patient is not nervous/anxious.      Physical Exam Updated Vital Signs BP 106/74 (  BP Location: Right Arm)   Pulse 72   Temp 98.4 F (36.9 C) (Oral)   Resp 18   LMP 07/18/2019   SpO2 97%   Physical Exam Vitals signs and nursing note reviewed.  Constitutional:      General: She is not in acute distress.    Appearance: She is well-developed. She is not diaphoretic.  HENT:     Head: Normocephalic and atraumatic.  Eyes:     General: No scleral icterus.    Conjunctiva/sclera: Conjunctivae normal.  Neck:     Musculoskeletal: Normal range of motion.  Cardiovascular:     Rate and Rhythm: Normal rate and regular rhythm.     Pulses:          Dorsalis pedis pulses are 2+ on the right side and 2+ on the left side.     Heart  sounds: No murmur.     Comments: Capillary refill < 3 sec Pulmonary:     Effort: Pulmonary effort is normal. No respiratory distress.  Musculoskeletal: Normal range of motion.       Feet:     Comments: Full range of motion of the right ankle.  Slightly decreased range of motion of the right toe given pain  Skin:    General: Skin is warm and dry.  Neurological:     Mental Status: She is alert and oriented to person, place, and time.     Comments: Sensation: Intact to normal touch in the right great toe Strength: 5/5 with flexion and plantarflexion of the right great toe      ED Treatments / Results   Radiology Dg Toe Great Right  Result Date: 08/13/2019 CLINICAL DATA:  Right great toe injury. EXAM: RIGHT GREAT TOE COMPARISON:  No recent. FINDINGS: Small fracture chip noted arising from the distal tuft of the right great toe distal phalanx. Nail avulsion noted. No radiopaque foreign body. IMPRESSION: Small fracture chip noted arising from the distal tuft of the right great toe distal phalanx. Nail avulsion noted. Electronically Signed   By: Marcello Moores  Register   On: 08/13/2019 05:54    Procedures .Marland KitchenLaceration Repair  Date/Time: 08/13/2019 7:21 AM Performed by: Abigail Butts, PA-C Authorized by: Abigail Butts, PA-C   Consent:    Consent obtained:  Verbal   Consent given by:  Patient   Risks discussed:  Infection, need for additional repair, pain, poor cosmetic result and poor wound healing   Alternatives discussed:  No treatment and delayed treatment Universal protocol:    Procedure explained and questions answered to patient or proxy's satisfaction: yes     Relevant documents present and verified: yes     Test results available and properly labeled: yes     Imaging studies available: yes     Required blood products, implants, devices, and special equipment available: yes     Site/side marked: yes     Immediately prior to procedure, a time out was called: yes      Patient identity confirmed:  Verbally with patient Anesthesia (see MAR for exact dosages):    Anesthesia method:  Nerve block   Block needle gauge:  27 G   Block anesthetic:  Lidocaine 1% w/o epi   Block injection procedure:  Anatomic landmarks identified, anatomic landmarks palpated, negative aspiration for blood and introduced needle   Block outcome:  Anesthesia achieved Laceration details:    Location:  Toe   Toe location:  R big toe   Length (cm):  2.5 Repair type:  Repair type:  Intermediate Pre-procedure details:    Preparation:  Patient was prepped and draped in usual sterile fashion and imaging obtained to evaluate for foreign bodies Exploration:    Hemostasis achieved with:  Direct pressure   Wound exploration: entire depth of wound probed and visualized   Treatment:    Area cleansed with:  Saline   Amount of cleaning:  Standard   Irrigation solution:  Sterile water   Irrigation volume:  566mL   Irrigation method:  Syringe Skin repair:    Repair method:  Sutures   Suture size:  5-0   Suture material:  Chromic gut   Suture technique:  Simple interrupted   Number of sutures:  4 Approximation:    Approximation:  Loose Post-procedure details:    Dressing:  Open (no dressing)   Patient tolerance of procedure:  Tolerated well, no immediate complications   (including critical care time)  Medications Ordered in ED Medications  Tdap (BOOSTRIX) injection 0.5 mL (0.5 mLs Intramuscular Not Given 08/13/19 0550)  lidocaine (PF) (XYLOCAINE) 1 % injection 5 mL (5 mLs Infiltration Given by Other 08/13/19 0548)  oxyCODONE (Oxy IR/ROXICODONE) immediate release tablet 10 mg (10 mg Oral Given 08/13/19 0538)  ondansetron (ZOFRAN-ODT) disintegrating tablet 4 mg (4 mg Oral Given 08/13/19 [id number])     Initial Impression / Assessment and Plan / ED Course  I have reviewed the triage vital signs and the nursing notes.  Pertinent labs & imaging results that were available during my care of  the patient were reviewed by me and considered in my medical decision making (see chart for details).        Patient presents with injury to the right great toe.  Nail is broken and pulled completely out of the matrix and off the nail bed.  2.5 cm C-shaped laceration to the nailbed with jagged edges.  Repaired and approximated as close as possible given some missing tissue.  Matrix splinted with petroleum gauze to the best of my ability as the nail is broken and the matrix is disrupted.  X-ray with small fracture.  Will consider as open fracture.  D/c home with doxy and refer to podiatry.  Discussed potential infection and reasons to return immediately to the emergency department.  Also discussed importance of close podiatry follow-up  Final Clinical Impressions(s) / ED Diagnoses   Final diagnoses:  Laceration of nail bed of toe, initial encounter  Open nondisplaced fracture of distal phalanx of right great toe, initial encounter    ED Discharge Orders         Ordered    doxycycline (VIBRAMYCIN) 100 MG capsule  2 times daily     08/13/19 0727           Elli Groesbeck, Jarrett Soho, PA-C 08/13/19 0729    Orpah Greek, MD 08/13/19 [phone]

## 2019-08-14 ENCOUNTER — Telehealth: Payer: Self-pay | Admitting: *Deleted

## 2019-08-14 NOTE — Telephone Encounter (Signed)
Pt states she has not been seen in the office yet, but was seen in the ED and the toenail bed dressed with a petroleum gauze, and the outer dressing is bloody, should she remove. I told pt that it would benefit her to remove the outer gauze prior to her appt, if she did not have more of the petroleum gauze if it came off cover the area with a light coating of antibiotic ointment. Pt states they gave her more petroleum gauze. I told her to use that and cover with sterile gauze.

## 2019-08-14 NOTE — Telephone Encounter (Signed)
Pt states she has an appt and needs to get information concerning the toe wrap.

## 2019-08-15 ENCOUNTER — Other Ambulatory Visit: Payer: Self-pay

## 2019-08-15 ENCOUNTER — Other Ambulatory Visit: Payer: Self-pay | Admitting: Podiatry

## 2019-08-15 ENCOUNTER — Ambulatory Visit (INDEPENDENT_AMBULATORY_CARE_PROVIDER_SITE_OTHER): Payer: No Typology Code available for payment source

## 2019-08-15 ENCOUNTER — Ambulatory Visit: Payer: No Typology Code available for payment source | Admitting: Podiatry

## 2019-08-15 VITALS — Temp 98.2°F

## 2019-08-15 DIAGNOSIS — S90211A Contusion of right great toe with damage to nail, initial encounter: Secondary | ICD-10-CM

## 2019-08-15 DIAGNOSIS — S91201A Unspecified open wound of right great toe with damage to nail, initial encounter: Secondary | ICD-10-CM

## 2019-08-15 DIAGNOSIS — M79671 Pain in right foot: Secondary | ICD-10-CM

## 2019-08-15 MED ORDER — TRAMADOL HCL 50 MG PO TABS: 50.0000 mg | ORAL_TABLET | Freq: Four times a day (QID) | ORAL | 0 refills | Status: AC | PRN

## 2019-08-15 MED ORDER — IBUPROFEN 800 MG PO TABS
800.0000 mg | ORAL_TABLET | Freq: Three times a day (TID) | ORAL | 1 refills | Status: DC | PRN
Start: 2019-08-15 — End: 2019-11-28

## 2019-08-15 MED ORDER — GENTAMICIN SULFATE 0.1 % EX CREA: 1.0000 "application " | TOPICAL_CREAM | Freq: Two times a day (BID) | CUTANEOUS | 1 refills | Status: DC

## 2019-08-16 ENCOUNTER — Telehealth: Payer: Self-pay | Admitting: *Deleted

## 2019-08-16 NOTE — Telephone Encounter (Signed)
Pt asked if she was to continue the doxycycline from the hospital and gentamicin cream.

## 2019-08-16 NOTE — Telephone Encounter (Signed)
I called pt and informed she should continue the doxycycline to completion and gentamicin cream until the toenail bed is healed. Pt asked if she could take the bandaid off to shower. I told her to shower with the bandaid on and it would help it not stick for removal and before getting out to the shower take a clean wash cloth wet it and put a little soap on it and wipe gently over the toenail site, rinse well and pat dry and apply gentamicin and non-stick bandaid. Pt states understanding.

## 2019-08-17 NOTE — Telephone Encounter (Signed)
Patient called with several questions regarding the care of her toe.  I advised her to keep the area clean gently wash with a cottonball, soap and water, and rinse.  She is to dab the toe dry and apply gentamicin cream and a bandage.  I did tell her that the brown-colored discharge from the toe was normal and that this could last for several days.  We discussed signs and symptoms of infection versus swelling and inflammation.  She is to call with any further questions or concerns.

## 2019-08-19 NOTE — Progress Notes (Signed)
   HPI: 33 y.o. female presenting today as a new patient with a chief complaint of an injury to the right great toe that occurred two days ago. She states her glass shower door came off the tracks and fell onto the right great toe causing her toenail to be torn away from the nail bed. She presented to the ED immediately after where she received 4 sutures and a prescription for Doxycycline. She was told the toe was fractured as well. Touching the toe or applying pressure to it increases the pain. Patient is here for further evaluation and treatment.   Past Medical History:  Diagnosis Date  . Cancer [facility])    cevical laser surgery     Physical Exam: General: The patient is alert and oriented x3 in no acute distress.  Dermatology: Traumatic loss of right great toenail. Skin is warm, dry and supple bilateral lower extremities. Negative for open lesions or macerations.  Vascular: Palpable pedal pulses bilaterally. No edema or erythema noted. Capillary refill within normal limits.  Neurological: Epicritic and protective threshold grossly intact bilaterally.   Musculoskeletal Exam: Pain with palpation noted to the right great toe. Range of motion within normal limits to all pedal and ankle joints bilateral. Muscle strength 5/5 in all groups bilateral.   Radiographic Exam:  Normal osseous mineralization. Joint spaces preserved. No fracture/dislocation/boney destruction.    Assessment: 1. Traumatic loss of right great toenail    Plan of Care:  1. Patient evaluated. X-Rays reviewed.  2. Dressing changed.  3. Post op shoe dispensed.  4. Prescription for Gentamicin cream provided to patient to use daily with a bandage.  5. Prescription for Tramadol provided to patient.  6. Prescription for Motrin 800 mg provided to patient.  7. Return to clinic in 2 weeks.      Edrick Kins, DPM [facility]  Dr. Edrick Kins, DPM    [address], [place] [address]                Office [phone]  Fax [phone]

## 2019-08-20 ENCOUNTER — Encounter: Payer: Self-pay | Admitting: Podiatry

## 2019-08-20 ENCOUNTER — Ambulatory Visit: Payer: No Typology Code available for payment source | Admitting: Podiatry

## 2019-08-20 ENCOUNTER — Other Ambulatory Visit: Payer: Self-pay

## 2019-08-20 DIAGNOSIS — S91201A Unspecified open wound of right great toe with damage to nail, initial encounter: Secondary | ICD-10-CM

## 2019-08-20 MED ORDER — ONDANSETRON HCL 4 MG PO TABS: 4.0000 mg | ORAL_TABLET | Freq: Four times a day (QID) | ORAL | 0 refills | Status: DC | PRN

## 2019-08-21 NOTE — Progress Notes (Signed)
   Subjective: 33 year old female presenting today for follow up evaluation of traumatic loss of the right great toenail that occurred on 08/13/2019. She is concerned about infection because the Doxycycline caused nausea and vomiting. She has been using the Gentamicin cream and the post op shoe as directed. Patient is here for further evaluation and treatment.   Past Medical History:  Diagnosis Date  . Cancer [facility])    cevical laser surgery    Objective: Skin is warm, dry and supple. Nail bed and respective nail fold appears to be healing appropriately. Open wound to the associated nail fold with a granular wound base and moderate amount of fibrotic tissue. Minimal drainage noted.   Assessment: #1 traumatic loss of the right great toenail  #2 open wound periungual nail fold and nail bed of respective digit.   Plan of care: #1 patient was evaluated  #2 debridement of open wound was performed to the periungual border and nail fold of the respective toe using a currette. Antibiotic ointment and Band-Aid was applied. #3 Continue using Gentamicin cream daily with a bandage.  #4 Continue using post op shoe.  #5 Prescription for Zofran 4 mg as needed for opioid induced nausea and vomiting.  #6 patient is to return to clinic in 3 weeks.   Edrick Kins, DPM [facility]  Dr. Edrick Kins, [address]                                        [place], [place] [address]                Office [phone]  Fax [phone]

## 2019-08-24 ENCOUNTER — Other Ambulatory Visit: Payer: Self-pay | Admitting: Obstetrics and Gynecology

## 2019-08-24 DIAGNOSIS — N6310 Unspecified lump in the right breast, unspecified quadrant: Secondary | ICD-10-CM

## 2019-08-28 ENCOUNTER — Ambulatory Visit
Admission: RE | Admit: 2019-08-28 | Discharge: 2019-08-28 | Disposition: A | Discharge status: Home / Self Care | Payer: No Typology Code available for payment source | Source: Ambulatory Visit | Attending: Obstetrics and Gynecology | Admitting: Obstetrics and Gynecology

## 2019-08-28 ENCOUNTER — Other Ambulatory Visit: Payer: Self-pay | Admitting: Obstetrics and Gynecology

## 2019-08-28 ENCOUNTER — Other Ambulatory Visit: Payer: Self-pay

## 2019-08-28 DIAGNOSIS — N6310 Unspecified lump in the right breast, unspecified quadrant: Secondary | ICD-10-CM

## 2019-08-29 ENCOUNTER — Ambulatory Visit: Payer: No Typology Code available for payment source | Admitting: Podiatry

## 2019-09-03 ENCOUNTER — Telehealth: Payer: Self-pay | Admitting: *Deleted

## 2019-09-03 MED ORDER — TRAMADOL HCL 50 MG PO TABS: 50.0000 mg | ORAL_TABLET | Freq: Four times a day (QID) | ORAL | 0 refills | Status: AC | PRN

## 2019-09-03 NOTE — Telephone Encounter (Signed)
Left message with Tramadol orders  [facility] [phone].

## 2019-09-03 NOTE — Telephone Encounter (Signed)
Unable to leave a message on pt's voicemail, recording continue to say nothing was recorded. Dr. Trinidad Curet refill of the tramadol as 08/15/2019.

## 2019-09-03 NOTE — Telephone Encounter (Signed)
Pt called for refill of the tramadol.

## 2019-09-10 ENCOUNTER — Ambulatory Visit: Payer: No Typology Code available for payment source | Admitting: Podiatry

## 2019-09-10 ENCOUNTER — Other Ambulatory Visit: Payer: Self-pay

## 2019-09-10 DIAGNOSIS — S91201A Unspecified open wound of right great toe with damage to nail, initial encounter: Secondary | ICD-10-CM

## 2019-09-13 NOTE — Progress Notes (Signed)
   Subjective: 33 year old female presenting today for follow up evaluation of traumatic loss of the right great toenail that occurred on 08/13/2019. She states she is doing well. She has been soaking the toe in Epsom salt and using Gentamicin cream daily as directed. There are no worsening factors noted. Patient is here for further evaluation and treatment.   Past Medical History:  Diagnosis Date  . Cancer [facility])    cevical laser surgery    Objective: Skin is warm, dry and supple. Nail bed and respective nail fold appears to be healing appropriately. Open wound to the associated nail fold with a granular wound base and moderate amount of fibrotic tissue. Minimal drainage noted.   Assessment: #1 traumatic loss of the right great toenail  #2 open wound periungual nail fold and nail bed of respective digit.   Plan of care: #1 patient was evaluated  #2 Recommended antibiotic cream daily with a bandage for three weeks.  #3 Recommended good shoe gear.  #4 May resume full activity with no restrictions.  #5 Return to clinic as needed.    Edrick Kins, DPM [facility]  Dr. Edrick Kins, [address]                                        [place], [place] [address]                Office [phone]  Fax [phone]

## 2019-11-15 ENCOUNTER — Other Ambulatory Visit: Payer: No Typology Code available for payment source

## 2019-11-19 ENCOUNTER — Ambulatory Visit: Payer: No Typology Code available for payment source | Attending: Internal Medicine

## 2019-11-19 DIAGNOSIS — U071 COVID-19: Secondary | ICD-10-CM

## 2019-11-21 LAB — NOVEL CORONAVIRUS, NAA: SARS-CoV-2, NAA: NOT DETECTED

## 2019-11-28 ENCOUNTER — Other Ambulatory Visit: Payer: No Typology Code available for payment source

## 2019-11-28 ENCOUNTER — Other Ambulatory Visit: Payer: Self-pay

## 2019-11-28 MED ORDER — IBUPROFEN 800 MG PO TABS: 800.0000 mg | ORAL_TABLET | Freq: Three times a day (TID) | ORAL | 1 refills | Status: DC | PRN

## 2019-11-28 NOTE — Progress Notes (Signed)
Rx for Ibuprofen 800mg  sent to [facility]

## 2020-03-24 ENCOUNTER — Other Ambulatory Visit: Payer: No Typology Code available for payment source

## 2020-10-10 IMAGING — DX DG TOE GREAT 2+V*R*
3 series · 3 of 3 positions shown · non-contrast
Comparison: No recent.

CLINICAL DATA: Right great toe injury.

EXAM:
RIGHT GREAT TOE

[toe ap]
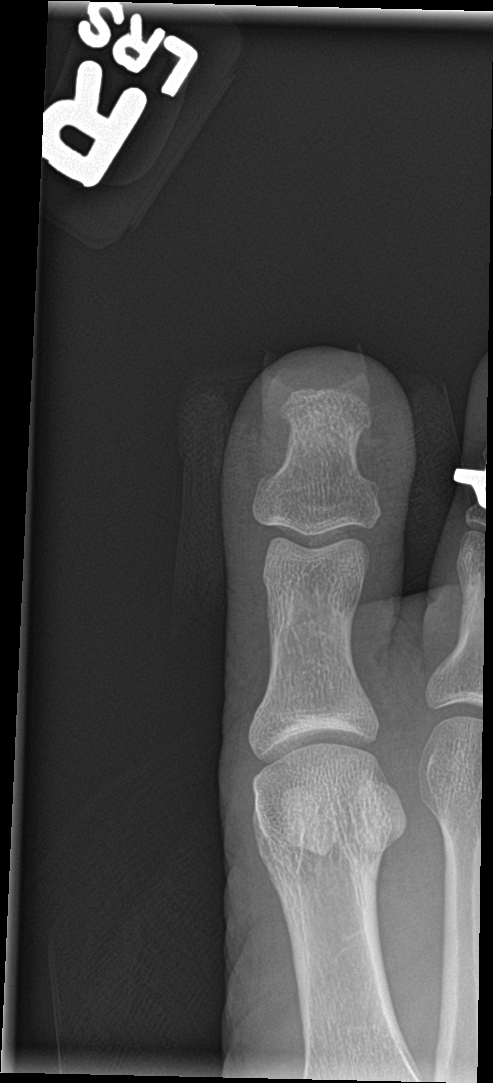

[toe obl]
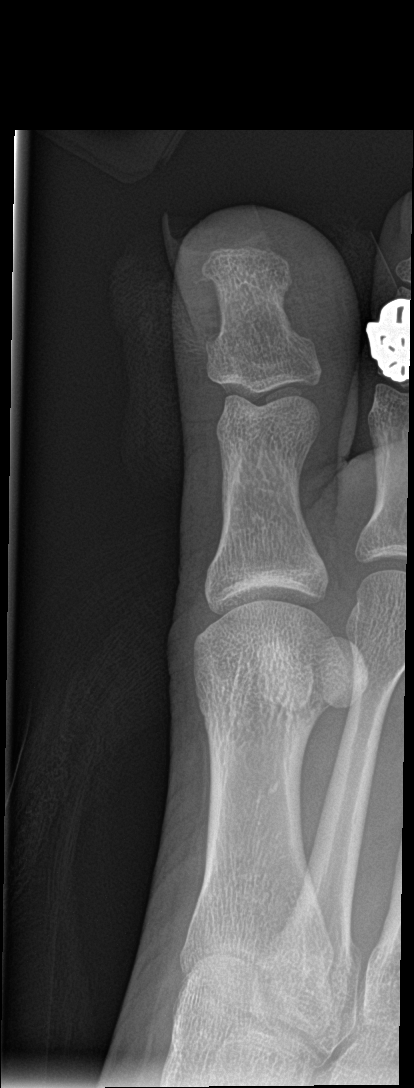

[toe lat]
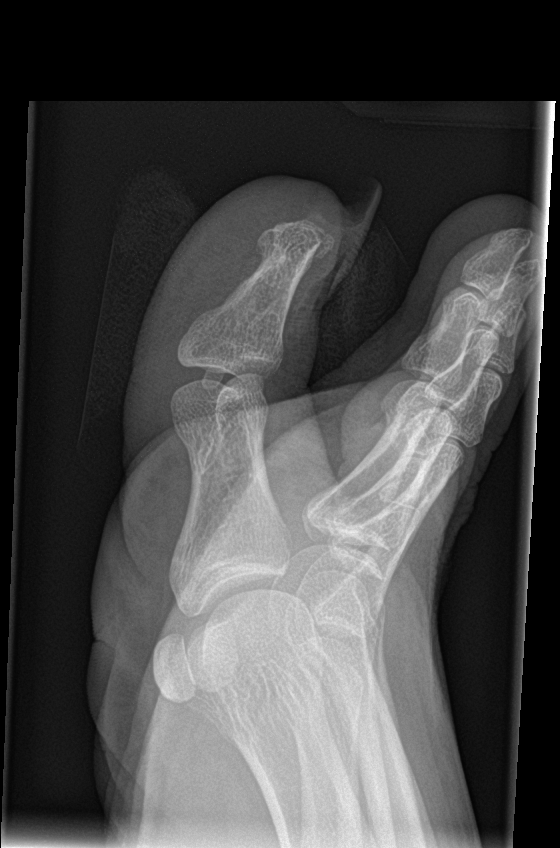

[3 of 3 positions shown; findings below may reference images not displayed]

FINDINGS: Small fracture chip noted arising from the distal tuft of the right
great toe distal phalanx. Nail avulsion noted. No radiopaque foreign
body.
IMPRESSION: Small fracture chip noted arising from the distal tuft of the right
great toe distal phalanx. Nail avulsion noted.

## 2021-11-22 NOTE — L&D Delivery Note (Signed)
DELIVERY NOTE  Pt complete and at +2 station with urge to push. Epidural controlling pain. Pt pushed and delivered a viable female infant in OP position. Anterior and posterior shoulders spontaneously delivered with next two pushes; body easily followed next. Infant placed on mothers abdomen and bulb suction of mouth and nose performed. Cord was then clamped and cut by FOB. Cord blood obtained, 3VC. NICU present for delivery. Baby had a vigorous spontaneous cry noted. Placenta then delivered at 1846 intact. Fundal massage performed and pitocin per protocol. Fundus firm. The following lacerations were noted: NONE, EBL 210cc. Mother and baby stable. Counts correct   Infant time: 1844 Gender: female Placenta time: 1846 Apgars: 9/9 Weight:2280g/5lb0oz

## 2022-01-19 LAB — HEPATITIS C ANTIBODY: HCV Ab: NEGATIVE

## 2022-01-19 LAB — OB RESULTS CONSOLE ABO/RH: RH Type: NEGATIVE

## 2022-01-19 LAB — OB RESULTS CONSOLE HEPATITIS B SURFACE ANTIGEN: Hepatitis B Surface Ag: NEGATIVE

## 2022-01-19 LAB — OB RESULTS CONSOLE GC/CHLAMYDIA
Chlamydia: NEGATIVE
Neisseria Gonorrhea: NEGATIVE

## 2022-01-19 LAB — OB RESULTS CONSOLE ANTIBODY SCREEN: Antibody Screen: NEGATIVE

## 2022-01-19 LAB — OB RESULTS CONSOLE RUBELLA ANTIBODY, IGM: Rubella: IMMUNE

## 2022-01-19 LAB — OB RESULTS CONSOLE HIV ANTIBODY (ROUTINE TESTING): HIV: NONREACTIVE

## 2022-01-19 LAB — OB RESULTS CONSOLE RPR: RPR: NONREACTIVE

## 2022-01-26 ENCOUNTER — Other Ambulatory Visit: Payer: Self-pay

## 2022-04-21 ENCOUNTER — Encounter: Payer: Self-pay | Admitting: *Deleted

## 2022-04-23 ENCOUNTER — Ambulatory Visit: Payer: Self-pay | Admitting: Genetics

## 2022-04-23 ENCOUNTER — Encounter: Payer: Self-pay | Admitting: *Deleted

## 2022-04-23 ENCOUNTER — Ambulatory Visit: Payer: No Typology Code available for payment source | Attending: Obstetrics and Gynecology

## 2022-04-23 ENCOUNTER — Other Ambulatory Visit: Payer: Self-pay | Admitting: *Deleted

## 2022-04-23 ENCOUNTER — Ambulatory Visit (HOSPITAL_BASED_OUTPATIENT_CLINIC_OR_DEPARTMENT_OTHER): Payer: No Typology Code available for payment source | Admitting: Maternal & Fetal Medicine

## 2022-04-23 ENCOUNTER — Other Ambulatory Visit: Payer: Self-pay | Admitting: Obstetrics and Gynecology

## 2022-04-23 ENCOUNTER — Ambulatory Visit (HOSPITAL_BASED_OUTPATIENT_CLINIC_OR_DEPARTMENT_OTHER): Payer: No Typology Code available for payment source

## 2022-04-23 ENCOUNTER — Ambulatory Visit: Payer: No Typology Code available for payment source | Admitting: *Deleted

## 2022-04-23 VITALS — BP 109/76 | HR 102

## 2022-04-23 DIAGNOSIS — O365920 Maternal care for other known or suspected poor fetal growth, second trimester, not applicable or unspecified: Secondary | ICD-10-CM

## 2022-04-23 DIAGNOSIS — Z363 Encounter for antenatal screening for malformations: Secondary | ICD-10-CM

## 2022-04-23 DIAGNOSIS — O3507X0 Maternal care for (suspected) central nervous system malformation or damage in fetus, microcephaly, not applicable or unspecified: Secondary | ICD-10-CM

## 2022-04-23 DIAGNOSIS — Z315 Encounter for genetic counseling: Secondary | ICD-10-CM

## 2022-04-23 DIAGNOSIS — Z364 Encounter for antenatal screening for fetal growth retardation: Secondary | ICD-10-CM

## 2022-04-23 DIAGNOSIS — O09522 Supervision of elderly multigravida, second trimester: Secondary | ICD-10-CM

## 2022-04-23 NOTE — Progress Notes (Signed)
MFM Brief Note  Heather Charles is a G3P1 who is here at 40 w 0d with an EDD of 08/06/22 she is seen at the request of Dr. Crawford Givens regarding a concern for microcephaly.  She is overall doing well today without s/sx of preterm labor or preeclampsia.  She has a low risk cell free DNA. and negative carrier screening.  Her medical history is complicated by a LEEP and migraines.  Single intrauterine pregnancy is observed today.  Normal anatomy with measurements consistent with fetal growth restriction with an EFW of the 4th%.  There is good fetal movement and amniotic fluid volume The UA Dopplers are normal without evidence of AEDF or REDF.  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 I explained that the etiology includes placental insufficiency, chronic disease, infection, aneuploidy and other genetic syndromes. She has a low risk NIPS, neg CMV (per Ms. Newhard) and neg horizon. She has no additional risk factors for chronic disease. At this time I explained the diagnosis, evaluation and management to include serial fetal growth and antenatal testing to include UA Dopplers.   If the EFW < 3rd% or abnormal testing, I recommend delivery at 37 weeks otherwise consider delivery at 39 weeks.   Secondly, I discussed with Ms. Willeford that the head circumference is near the mean and not greater than the 3-5 Standard deviation and thus is likely normal.   Given this we have scheduled her to return in 2 weeks for UA Dopplers and NST. She will have a repeat growth in 3 weeks.  All questions answered.  I spent 30 minutes with > 50% in face to face consultation.  Vikki Ports, MD

## 2022-04-23 NOTE — Progress Notes (Signed)
Name: Heather Charles Indication: Genetic Counseling  DOB: 1986-01-14 Age: 36 y.o.   EDC: 08/06/2022 LMP: 11/05/2021 Referring Provider: Dr. Theodoro Parma: [id number] Counselor: AStaci Righter MS, CGC  OB Hx:: [id number] of Appointment: 04/23/2022  Accompanied by: Father of the current pregnancy, Bruce Rudd Face to Face Time: 341Minutes   Previous Testing Completed: CBC from 01/19/2022 reviewed. MCV within normal limits. It is unlikely that Heather Charles a beta thalassemia carrier or an alpha thalassemia carrier of the double-gene deletion. Individuals with a normal MCV may be single-gene deletion carriers, but it is unlikely that the current pregnancy would be affected with alpha or beta thalassemia major. Hemoglobin fractionation cascade from 01/19/2022 reviewed. No abnormal hemoglobin bands were noted.  Heather Charles previously completed Non-Invasive Prenatal Screening (NIPS) in this pregnancy. The result is low risk, consistent with a female fetus. This screening significantly reduces the risk that the current pregnancy has Down syndrome, Trisomy 191 Trisomy 159 and common sex chromosome conditions, however, the risk is not zero given the limitations of NIPS. Additionally, there are many genetic conditions that cannot be detected by NIPS.  Heather Charles completed carrier screening. She screened to not be a carrier for Cystic Fibrosis (CF), Spinal Muscular Atrophy (SMA), and Fragile X syndrome (30 and 31 CGG repeats). A negative result on carrier screening reduces the likelihood of being a carrier, however, does not entirely rule out the possibility.   Medical History:  This is Heather Charles's 3rd pregnancy. She has a son from a previous partner. She has had an elective loss. Reports she takes prenatal vitamins, zyrtec, and baby aspirin. Denies personal history of diabetes, high blood pressure, thyroid conditions, and seizures. Denies bleeding, infections, and fevers in this pregnancy. Denies using  tobacco, alcohol, or street drugs in this pregnancy.   Family History: A pedigree was created and scanned into Epic under the Media tab. JBrittinireports her maternal half sister's son has intellectual disability. She reports he is in a special education classroom. She reports he has trouble with his fine-motor skills.  Maternal ethnicity reported as White/Caucasian/Cuban and paternal ethnicity reported as BResearch scientist (physical sciences) Denies Ashkenazi Jewish ancestry. Family history not remarkable for consanguinity, individuals with birth defects, multiple spontaneous abortions, still births, or unexplained neonatal death.     Genetic Counseling:   Ultrasound. Heather Charles originally scheduled for genetic counseling for the indication of fetal microcephaly. Ultrasound today does not confirm fetal microcephaly. Today ultrasound examination shows fetal growth restriction. Please see separate ultrasound report for details. Fetal growth restriction can be due to many causes (chromosomal aneuploidies or other genetic conditions, prenatal infections, placental abnormalities, umbilical cord abnormalities, and other pregnancy complications). Heather Charles that she does not want to pursue amniocentesis for prenatal diagnosis and opted for a follow-up ultrasound on 05/07/22.  Advanced Maternal Age. With delivery at 89years, Heather Charles age-related risk to have a baby with Down syndrome is 1/310 and risk to have a baby with any chromosome condition is 1/141. Heather Charles previously completed Non-Invasive Prenatal Screening (NIPS) in this pregnancy. The result is low risk, consistent with a female fetus. This screening significantly reduces the risk that the current pregnancy has Down syndrome, Trisomy 172 Trisomy 175 and common sex chromosome conditions, however, the risk is not zero given the limitations of NIPS. Additionally, there are many genetic conditions that cannot be detected by NIPS.   Family history of intellectual  disability. Heather Charles her maternal half sister's son has intellectual disability. She reports he is in a  special education classroom. She reports he has trouble with his fine-motor skills. Intellectual disability may be due multifactorial, chromosomal, single-gene, environmental, and unknown causes. An accurate recurrence risk for intellectual disability cannot be quoted because medical records for the individual described as having intellectual disability are not available for review.  Birth Defects. All babies have approximately a 3-5% risk for a birth defect and a majority of these defects cannot be detected through the screening or diagnostic testing listed below. Ultrasound may detect some birth defects, but it may not detect all birth defects. About half of pregnancies with Down syndrome do not show any soft markers on ultrasound. A normal ultrasound does not guarantee a healthy pregnancy.    Testing/Screening Options:   Amniocentesis. This procedure is available for prenatal diagnosis. Possible procedural difficulties and complications that can arise include maternal infection, cramping, bleeding, fluid leakage, and/or pregnancy loss. The risk for pregnancy loss with an amniocentesis is 1/500. Per the [employer]necologists (ACOG) Practice Bulletin 162, all pregnant women should be offered prenatal assessment for aneuploidy by diagnostic testing regardless of maternal age or other risk factors. If indicated, genetic testing that could be ordered on an amniocentesis sample includes a fetal karyotype, fetal microarray, and testing for specific syndromes.  Expanded Carrier screening. We discussed that expanded carrier screening (ECS) is a testing option available that evaluates for a wide range of genetic conditions. Some of these conditions are severe and actionable, but also rare; others occur more commonly, but are less severe. We discussed that testing panels could  include more than 500 autosomal recessive or X-linked genetic conditions. In the event that one partner were found to be a carrier for one or more conditions, carrier screening would be available to the other partner for those conditions. This could help determine whether the current and future pregnancies are at increased risk for a particular recessive or X-linked genetic condition. We discussed the risks, benefits, and limitations of carrier screening. After thoughtful consideration of the options, Elana declined ECS at this time.      Patient Plan:  Proceed with: Routine prenatal care Informed consent was obtained. All questions were answered.  Declined: Amniocentesis, Expanded Carrier Screening   Thank you for sharing in the care of [place].  Please do not hesitate to contact us if you have any questions.  Staci Righter, MS, [facility]

## 2022-05-06 ENCOUNTER — Other Ambulatory Visit: Payer: No Typology Code available for payment source

## 2022-05-06 ENCOUNTER — Ambulatory Visit: Payer: No Typology Code available for payment source

## 2022-05-07 ENCOUNTER — Other Ambulatory Visit: Payer: Self-pay | Admitting: *Deleted

## 2022-05-07 ENCOUNTER — Ambulatory Visit: Payer: No Typology Code available for payment source | Attending: Obstetrics and Gynecology | Admitting: *Deleted

## 2022-05-07 ENCOUNTER — Ambulatory Visit (HOSPITAL_BASED_OUTPATIENT_CLINIC_OR_DEPARTMENT_OTHER): Payer: No Typology Code available for payment source

## 2022-05-07 ENCOUNTER — Encounter: Payer: Self-pay | Admitting: *Deleted

## 2022-05-07 ENCOUNTER — Ambulatory Visit (HOSPITAL_BASED_OUTPATIENT_CLINIC_OR_DEPARTMENT_OTHER): Payer: No Typology Code available for payment source | Admitting: *Deleted

## 2022-05-07 VITALS — BP 103/60 | HR 81

## 2022-05-07 DIAGNOSIS — O365920 Maternal care for other known or suspected poor fetal growth, second trimester, not applicable or unspecified: Secondary | ICD-10-CM

## 2022-05-07 DIAGNOSIS — Z363 Encounter for antenatal screening for malformations: Secondary | ICD-10-CM | POA: Diagnosis not present

## 2022-05-07 DIAGNOSIS — O3509X0 Maternal care for (suspected) other central nervous system malformation or damage in fetus, not applicable or unspecified: Secondary | ICD-10-CM | POA: Diagnosis not present

## 2022-05-07 DIAGNOSIS — O359XX0 Maternal care for (suspected) fetal abnormality and damage, unspecified, not applicable or unspecified: Secondary | ICD-10-CM | POA: Diagnosis not present

## 2022-05-07 DIAGNOSIS — Z3A27 27 weeks gestation of pregnancy: Secondary | ICD-10-CM | POA: Insufficient documentation

## 2022-05-07 DIAGNOSIS — O09523 Supervision of elderly multigravida, third trimester: Secondary | ICD-10-CM

## 2022-05-07 DIAGNOSIS — O09522 Supervision of elderly multigravida, second trimester: Secondary | ICD-10-CM | POA: Diagnosis present

## 2022-05-07 DIAGNOSIS — O365930 Maternal care for other known or suspected poor fetal growth, third trimester, not applicable or unspecified: Secondary | ICD-10-CM

## 2022-05-07 NOTE — Procedures (Signed)
Heather Charles [birth year] [redacted]w[redacted]d Fetus A Non-Stress Test Interpretation for 05/07/22  Indication: IUGR  Fetal Heart Rate A Mode: External Baseline Rate (A): 145 bpm Variability: Moderate Accelerations: 10 x 10 Decelerations: None Multiple birth?: No  Uterine Activity Mode: Palpation, Toco Contraction Frequency (min): None  Interpretation (Fetal Testing) Nonstress Test Interpretation: Reactive Overall Impression: Reassuring for gestational age Comments: Dr. BGertie Exonreviewed tracing.

## 2022-05-13 ENCOUNTER — Ambulatory Visit (HOSPITAL_BASED_OUTPATIENT_CLINIC_OR_DEPARTMENT_OTHER): Payer: No Typology Code available for payment source | Admitting: *Deleted

## 2022-05-13 ENCOUNTER — Ambulatory Visit: Payer: No Typology Code available for payment source | Attending: Maternal & Fetal Medicine

## 2022-05-13 ENCOUNTER — Ambulatory Visit: Payer: No Typology Code available for payment source | Admitting: *Deleted

## 2022-05-13 VITALS — BP 107/69 | HR 100

## 2022-05-13 DIAGNOSIS — O359XX0 Maternal care for (suspected) fetal abnormality and damage, unspecified, not applicable or unspecified: Secondary | ICD-10-CM

## 2022-05-13 DIAGNOSIS — Z362 Encounter for other antenatal screening follow-up: Secondary | ICD-10-CM

## 2022-05-13 DIAGNOSIS — O283 Abnormal ultrasonic finding on antenatal screening of mother: Secondary | ICD-10-CM | POA: Diagnosis present

## 2022-05-13 DIAGNOSIS — O09523 Supervision of elderly multigravida, third trimester: Secondary | ICD-10-CM | POA: Insufficient documentation

## 2022-05-13 DIAGNOSIS — Z3A27 27 weeks gestation of pregnancy: Secondary | ICD-10-CM | POA: Diagnosis not present

## 2022-05-13 DIAGNOSIS — O09522 Supervision of elderly multigravida, second trimester: Secondary | ICD-10-CM

## 2022-05-13 DIAGNOSIS — O365921 Maternal care for other known or suspected poor fetal growth, second trimester, fetus 1: Secondary | ICD-10-CM

## 2022-05-13 DIAGNOSIS — O365920 Maternal care for other known or suspected poor fetal growth, second trimester, not applicable or unspecified: Secondary | ICD-10-CM

## 2022-05-13 NOTE — Procedures (Signed)
Heather Charles [birth year] [redacted]w[redacted]d Fetus A Non-Stress Test Interpretation for 05/13/22  Indication: IUGR  Fetal Heart Rate A Mode: External Baseline Rate (A): 145 bpm Variability: Moderate Accelerations: 10 x 10 Decelerations: Variable Multiple birth?: No  Uterine Activity Mode: Palpation, Toco Contraction Frequency (min): none Resting Tone Palpated: Relaxed  Interpretation (Fetal Testing) Nonstress Test Interpretation: Reactive Overall Impression: Reassuring for gestational age Comments: Dr. SDonalee Citrinreviewed tracing

## 2022-05-15 ENCOUNTER — Encounter (HOSPITAL_COMMUNITY): Payer: Self-pay | Admitting: Obstetrics and Gynecology

## 2022-05-15 ENCOUNTER — Inpatient Hospital Stay (HOSPITAL_COMMUNITY)
Admission: AD | Admit: 2022-05-15 | Discharge: 2022-05-15 | Disposition: A | Discharge status: Home / Self Care | Payer: No Typology Code available for payment source | Attending: Obstetrics and Gynecology | Admitting: Obstetrics and Gynecology

## 2022-05-15 DIAGNOSIS — Z3A28 28 weeks gestation of pregnancy: Secondary | ICD-10-CM | POA: Diagnosis not present

## 2022-05-15 DIAGNOSIS — O26893 Other specified pregnancy related conditions, third trimester: Secondary | ICD-10-CM | POA: Diagnosis not present

## 2022-05-15 DIAGNOSIS — O365930 Maternal care for other known or suspected poor fetal growth, third trimester, not applicable or unspecified: Secondary | ICD-10-CM | POA: Diagnosis present

## 2022-05-15 DIAGNOSIS — Z3689 Encounter for other specified antenatal screening: Secondary | ICD-10-CM | POA: Diagnosis not present

## 2022-05-15 DIAGNOSIS — Z3493 Encounter for supervision of normal pregnancy, unspecified, third trimester: Secondary | ICD-10-CM

## 2022-05-19 ENCOUNTER — Ambulatory Visit: Payer: No Typology Code available for payment source

## 2022-05-26 ENCOUNTER — Encounter: Payer: No Typology Code available for payment source | Attending: Obstetrics and Gynecology | Admitting: Registered"

## 2022-05-26 DIAGNOSIS — Z3A00 Weeks of gestation of pregnancy not specified: Secondary | ICD-10-CM | POA: Insufficient documentation

## 2022-05-26 DIAGNOSIS — O24419 Gestational diabetes mellitus in pregnancy, unspecified control: Secondary | ICD-10-CM | POA: Insufficient documentation

## 2022-05-26 DIAGNOSIS — Z713 Dietary counseling and surveillance: Secondary | ICD-10-CM | POA: Diagnosis not present

## 2022-05-27 ENCOUNTER — Encounter: Payer: Self-pay | Admitting: Registered"

## 2022-05-27 DIAGNOSIS — O24419 Gestational diabetes mellitus in pregnancy, unspecified control: Secondary | ICD-10-CM | POA: Insufficient documentation

## 2022-05-27 NOTE — Progress Notes (Signed)
Patient was seen on 05/26/22 for Gestational Diabetes self-management class at [facility]. The following learning objectives were met by the patient during this course:  States the definition of Gestational Diabetes States why dietary management is important in controlling blood glucose Describes the effects each nutrient has on blood glucose levels Demonstrates ability to create a balanced meal plan Demonstrates carbohydrate counting  States when to check blood glucose levels Demonstrates proper blood glucose monitoring techniques States the effect of stress and exercise on blood glucose levels States the importance of limiting caffeine and abstaining from alcohol and smoking  Blood glucose monitor given: None. Patient brought meter to class for instruction CBG: 90 mg/dL  Patient instructed to monitor glucose levels: FBS: 60 - <95; 1 hour: <140; 2 hour: <120  Patient received handouts: Nutrition Diabetes and Pregnancy, including carb counting list  Patient will be seen for follow-up as needed.

## 2022-05-28 ENCOUNTER — Ambulatory Visit: Payer: No Typology Code available for payment source

## 2022-06-04 ENCOUNTER — Other Ambulatory Visit: Payer: Self-pay | Admitting: *Deleted

## 2022-06-04 ENCOUNTER — Other Ambulatory Visit: Payer: Self-pay | Admitting: Maternal & Fetal Medicine

## 2022-06-04 ENCOUNTER — Ambulatory Visit: Payer: No Typology Code available for payment source | Admitting: *Deleted

## 2022-06-04 ENCOUNTER — Ambulatory Visit: Payer: No Typology Code available for payment source | Attending: Maternal & Fetal Medicine

## 2022-06-04 VITALS — BP 102/69 | HR 81

## 2022-06-04 DIAGNOSIS — O3507X0 Maternal care for (suspected) central nervous system malformation or damage in fetus, microcephaly, not applicable or unspecified: Secondary | ICD-10-CM

## 2022-06-04 DIAGNOSIS — O365930 Maternal care for other known or suspected poor fetal growth, third trimester, not applicable or unspecified: Secondary | ICD-10-CM | POA: Diagnosis not present

## 2022-06-04 DIAGNOSIS — O365931 Maternal care for other known or suspected poor fetal growth, third trimester, fetus 1: Secondary | ICD-10-CM

## 2022-06-04 DIAGNOSIS — O283 Abnormal ultrasonic finding on antenatal screening of mother: Secondary | ICD-10-CM | POA: Diagnosis present

## 2022-06-04 DIAGNOSIS — O09523 Supervision of elderly multigravida, third trimester: Secondary | ICD-10-CM | POA: Diagnosis not present

## 2022-06-04 DIAGNOSIS — Z3A31 31 weeks gestation of pregnancy: Secondary | ICD-10-CM

## 2022-06-04 DIAGNOSIS — O24410 Gestational diabetes mellitus in pregnancy, diet controlled: Secondary | ICD-10-CM

## 2022-06-04 NOTE — Procedures (Signed)
Heather Charles 04-04-1986 [redacted]w[redacted]d Fetus A Non-Stress Test Interpretation for 06/04/22  Indication: IUGR, ^UAD  Fetal Heart Rate A Mode: External Baseline Rate (A): 140 bpm Variability: Moderate Accelerations: 15 x 15 Decelerations: None Multiple birth?: No  Uterine Activity Mode: Palpation, Toco Contraction Frequency (min): none Resting Tone Palpated: Relaxed  Interpretation (Fetal Testing) Nonstress Test Interpretation: Reactive Overall Impression: Reassuring for gestational age Comments: Dr. SDonalee Citrinreviewed tracing

## 2022-06-11 ENCOUNTER — Ambulatory Visit: Payer: No Typology Code available for payment source | Admitting: *Deleted

## 2022-06-11 ENCOUNTER — Ambulatory Visit: Payer: No Typology Code available for payment source | Attending: Obstetrics and Gynecology

## 2022-06-11 VITALS — BP 107/63 | HR 104

## 2022-06-11 DIAGNOSIS — O42912 Preterm premature rupture of membranes, unspecified as to length of time between rupture and onset of labor, second trimester: Secondary | ICD-10-CM

## 2022-06-11 DIAGNOSIS — Z3A20 20 weeks gestation of pregnancy: Secondary | ICD-10-CM | POA: Insufficient documentation

## 2022-06-11 DIAGNOSIS — O365930 Maternal care for other known or suspected poor fetal growth, third trimester, not applicable or unspecified: Secondary | ICD-10-CM | POA: Insufficient documentation

## 2022-06-11 DIAGNOSIS — O4290 Premature rupture of membranes, unspecified as to length of time between rupture and onset of labor, unspecified weeks of gestation: Secondary | ICD-10-CM | POA: Insufficient documentation

## 2022-06-11 DIAGNOSIS — O365931 Maternal care for other known or suspected poor fetal growth, third trimester, fetus 1: Secondary | ICD-10-CM

## 2022-06-11 DIAGNOSIS — O4102X0 Oligohydramnios, second trimester, not applicable or unspecified: Secondary | ICD-10-CM | POA: Diagnosis not present

## 2022-06-11 NOTE — Procedures (Signed)
Heather Charles [birth year] [redacted]w[redacted]d Fetus A Non-Stress Test Interpretation for 06/11/22  Indication: IUGR  Fetal Heart Rate A Mode: External Baseline Rate (A): 145 bpm Variability: Moderate Accelerations: 15 x 15 Decelerations: None Multiple birth?: No  Uterine Activity Mode: Palpation, Toco Contraction Frequency (min): none Resting Tone Palpated: Relaxed  Interpretation (Fetal Testing) Nonstress Test Interpretation: Reactive Overall Impression: Reassuring for gestational age Comments: Dr. SDonalee Citrinreviewed tracing

## 2022-06-11 NOTE — Progress Notes (Unsigned)
Maternal-Fetal Medicine   Name: Heather Charles DOB: 09/24/2003 MRN: [id number] Referring Provider: Prentice Docker, MD  I had the pleasure of seeing Ms. Heather Charles today at the [facility].  She was accompanied by her mother and later her father joined during counseling. She is G3 P0020 at 20-weeks' gestation and is here for ultrasound evaluation because of the finding of oligohydramnios detected on ultrasound 2 days ago. Patient reports she had decreased fetal movements for about a week that prompted her to call your service and had ultrasound.  She reports she had intermittent vaginal discharge/leakage of amniotic fluid over 2 weeks.  No history of fever or abdominal pain or vaginal bleeding. Her prenatal course was otherwise uneventful.  Her pregnancy is well dated by 9-week ultrasound.  On cell-free fetal DNA screening, the risks of fetal aneuploidies are not increased.  Past medical history: No history of diabetes or hypertension or any chronic medical conditions. Past surgical history: Tonsillectomy. Medications: Prenatal vitamins. Allergies: No known drug allergies.  Ultrasound We performed both transabdominal and transvaginal ultrasound.  Fetal biometry is consistent with the previously established dates.  No measurable pocket of amniotic fluid is seen.  Severe oligohydramnios is present.  Fetal anatomical survey is extremely limited because of absence of amniotic fluid.  We performed a transvaginal ultrasound to evaluate the kidneys.  Both kidneys are seen and appear normal.  The cervix measures 3.1 cm, which is normal.  PPROM before Viability -Based on her history and ultrasound findings, the most-likely diagnosis is preterm premature rupture of membranes. -In a study involving 47 pregnant women with 427 fetuses (including 35 twins), outcomes of PPROM at 38, 90, 75 and 25 weeks' gestation were reported.   -Latency duration (0.5 to 145 days) did not differ by week of  gestational age at [facility]. -About 50% of women, regardless of gestational age, delivered within the first week of PPROM. Only 5 infants (7.1%) were born between 20- and 70-weeks' gestation. -Completions of clinical chorioamnionitis, placental abruption, cord prolapse were higher in women with PPROM. -Pulmonary hypoplasia was seen in 2%. Other studies showed an increase in pulmonary hypoplasia if severe oligohydramnios was diagnosed at [facility]. -Survival rates at discharge were 14% (22 weeks), 39% (23 weeks), 67% (24 weeks) and 76% (25 weeks).  -Overall, cerebral palsy rates correlated with gestational age at delivery. Patient was counseled on the increased risks of placental abruption, chorioamnionitis, cord prolapse and stillbirth. Pulmonary hypoplasia, neonatal death, cerebral palsy, respiratory-distress syndrome, and bronchopulmonary dysplasia are expected to be higher in pregnancies complicated by PPROM at previable gestational ages. Termination of pregnancy is an option and was discussed. Recent [employer] prohibits abortion after 12w 6d gestation. However, termination of pregnancy can be performed if there is real or potential risk to the health of the mother. PPROM is a risk factor for intrauterine sepsis that can lead to maternal mortality and morbidity including hysterectomy.  Patient informed she will continue her pregnancy regardless of fetal/neonatal outcomes. She was advised to come to the hospital if she develops fever or abdominal pain or vaginal bleeding. I advised her to abstain from sexual intercourse and avoid inserting anything into the vagina including tampons.  I explained the protocol of readmission at 23 weeks' gestation. Antenatal corticosteroids for fetal lung maturity will be given at readmission.   Patient will have neonatology consultation after admission.   I informed the patient that she is likely to be transferred to [facility] or any other tertiary care  center chosen by her provider.  Recommendations -An appointment was made for her to return in 2 weeks for a limited ultrasound. -Patient has a prenatal visit appointment at your office next week. -Advised against sexual intercourse. -Patient to come to L&D if she has severe abdominal pain or vaginal bleeding or fever. -Admit at [redacted] weeks gestation for inpatient management.   Thank you for consultation.  If you have any questions or concerns, please contact me the [facility].  Consultation including face-to-face (more than 50%) counseling 45 minutes.

## 2022-06-17 ENCOUNTER — Ambulatory Visit: Payer: No Typology Code available for payment source | Attending: Obstetrics and Gynecology

## 2022-06-17 ENCOUNTER — Ambulatory Visit: Payer: No Typology Code available for payment source | Admitting: *Deleted

## 2022-06-17 VITALS — BP 104/68 | HR 82

## 2022-06-17 DIAGNOSIS — O09523 Supervision of elderly multigravida, third trimester: Secondary | ICD-10-CM

## 2022-06-17 DIAGNOSIS — O365931 Maternal care for other known or suspected poor fetal growth, third trimester, fetus 1: Secondary | ICD-10-CM | POA: Insufficient documentation

## 2022-06-17 DIAGNOSIS — O24410 Gestational diabetes mellitus in pregnancy, diet controlled: Secondary | ICD-10-CM

## 2022-06-17 DIAGNOSIS — O365930 Maternal care for other known or suspected poor fetal growth, third trimester, not applicable or unspecified: Secondary | ICD-10-CM | POA: Diagnosis present

## 2022-06-17 DIAGNOSIS — O3507X0 Maternal care for (suspected) central nervous system malformation or damage in fetus, microcephaly, not applicable or unspecified: Secondary | ICD-10-CM

## 2022-06-17 DIAGNOSIS — Z3A32 32 weeks gestation of pregnancy: Secondary | ICD-10-CM

## 2022-06-17 NOTE — Procedures (Signed)
Heather Charles 1986/08/17 [redacted]w[redacted]d Fetus A Non-Stress Test Interpretation for 06/17/22  Indication: IUGR  Fetal Heart Rate A Mode: External Baseline Rate (A): 140 bpm Variability: Moderate Accelerations: 15 x 15 Decelerations: None Multiple birth?: No  Uterine Activity Mode: Palpation, Toco Contraction Frequency (min): none Resting Tone Palpated: Relaxed  Interpretation (Fetal Testing) Nonstress Test Interpretation: Reactive Overall Impression: Reassuring for gestational age Comments: Dr. SDonalee Citrinreviewed tracing

## 2022-06-18 ENCOUNTER — Other Ambulatory Visit: Payer: Self-pay | Admitting: *Deleted

## 2022-06-18 DIAGNOSIS — O365990 Maternal care for other known or suspected poor fetal growth, unspecified trimester, not applicable or unspecified: Secondary | ICD-10-CM

## 2022-06-18 DIAGNOSIS — O09523 Supervision of elderly multigravida, third trimester: Secondary | ICD-10-CM

## 2022-06-18 DIAGNOSIS — O24410 Gestational diabetes mellitus in pregnancy, diet controlled: Secondary | ICD-10-CM

## 2022-06-25 ENCOUNTER — Ambulatory Visit: Payer: No Typology Code available for payment source | Admitting: *Deleted

## 2022-06-25 ENCOUNTER — Encounter: Payer: Self-pay | Admitting: *Deleted

## 2022-06-25 ENCOUNTER — Ambulatory Visit: Payer: No Typology Code available for payment source | Attending: Obstetrics and Gynecology

## 2022-06-25 VITALS — BP 104/65 | HR 68

## 2022-06-25 DIAGNOSIS — O3507X0 Maternal care for (suspected) central nervous system malformation or damage in fetus, microcephaly, not applicable or unspecified: Secondary | ICD-10-CM

## 2022-06-25 DIAGNOSIS — O24410 Gestational diabetes mellitus in pregnancy, diet controlled: Secondary | ICD-10-CM

## 2022-06-25 DIAGNOSIS — O09523 Supervision of elderly multigravida, third trimester: Secondary | ICD-10-CM

## 2022-06-25 DIAGNOSIS — O365930 Maternal care for other known or suspected poor fetal growth, third trimester, not applicable or unspecified: Secondary | ICD-10-CM

## 2022-06-25 DIAGNOSIS — Z3A34 34 weeks gestation of pregnancy: Secondary | ICD-10-CM

## 2022-06-25 DIAGNOSIS — O365931 Maternal care for other known or suspected poor fetal growth, third trimester, fetus 1: Secondary | ICD-10-CM | POA: Diagnosis present

## 2022-06-25 NOTE — Procedures (Signed)
Heather Charles 26-Feb-1986 [redacted]w[redacted]d Fetus A Non-Stress Test Interpretation for 06/25/22  Indication: IUGR  Fetal Heart Rate A Mode: External Baseline Rate (A): 145 bpm Variability: Moderate Accelerations: 15 x 15 Decelerations: None Multiple birth?: No  Uterine Activity Mode: Toco Contraction Frequency (min): UI Contraction Duration (sec): 15-20 Contraction Quality: Mild (not felt by pt) Resting Tone Palpated: Relaxed  Interpretation (Fetal Testing) Nonstress Test Interpretation: Reactive Overall Impression: Reassuring for gestational age Comments: tracing reviewed by Dr. FAnnamaria Boots

## 2022-07-01 ENCOUNTER — Other Ambulatory Visit: Payer: No Typology Code available for payment source

## 2022-07-01 ENCOUNTER — Ambulatory Visit: Payer: Medicaid Other | Admitting: *Deleted

## 2022-07-01 ENCOUNTER — Ambulatory Visit: Payer: Medicaid Other | Attending: Obstetrics and Gynecology

## 2022-07-01 ENCOUNTER — Ambulatory Visit: Payer: No Typology Code available for payment source

## 2022-07-01 VITALS — BP 118/70 | HR 87

## 2022-07-01 DIAGNOSIS — O09523 Supervision of elderly multigravida, third trimester: Secondary | ICD-10-CM | POA: Insufficient documentation

## 2022-07-01 DIAGNOSIS — O365931 Maternal care for other known or suspected poor fetal growth, third trimester, fetus 1: Secondary | ICD-10-CM | POA: Insufficient documentation

## 2022-07-01 DIAGNOSIS — Z3A34 34 weeks gestation of pregnancy: Secondary | ICD-10-CM

## 2022-07-01 DIAGNOSIS — O24410 Gestational diabetes mellitus in pregnancy, diet controlled: Secondary | ICD-10-CM

## 2022-07-01 DIAGNOSIS — O365930 Maternal care for other known or suspected poor fetal growth, third trimester, not applicable or unspecified: Secondary | ICD-10-CM

## 2022-07-01 DIAGNOSIS — O3507X0 Maternal care for (suspected) central nervous system malformation or damage in fetus, microcephaly, not applicable or unspecified: Secondary | ICD-10-CM

## 2022-07-01 NOTE — Procedures (Signed)
Heather Charles 14-Sep-1986 [redacted]w[redacted]d Fetus A Non-Stress Test Interpretation for 07/01/22  Indication: IUGR  Fetal Heart Rate A Mode: External Baseline Rate (A): 145 bpm Variability: Moderate Accelerations: 15 x 15 Decelerations: None Multiple birth?: No  Uterine Activity Mode: Toco Contraction Frequency (min): none Resting Tone Palpated: Relaxed  Interpretation (Fetal Testing) Nonstress Test Interpretation: Reactive Overall Impression: Reassuring for gestational age Comments: tracing reviewed by Dr. FAnnamaria Boots

## 2022-07-08 ENCOUNTER — Ambulatory Visit: Payer: No Typology Code available for payment source | Attending: Obstetrics and Gynecology

## 2022-07-08 ENCOUNTER — Ambulatory Visit: Payer: No Typology Code available for payment source | Admitting: *Deleted

## 2022-07-08 VITALS — BP 108/70 | HR 87

## 2022-07-08 DIAGNOSIS — O365990 Maternal care for other known or suspected poor fetal growth, unspecified trimester, not applicable or unspecified: Secondary | ICD-10-CM | POA: Diagnosis not present

## 2022-07-08 DIAGNOSIS — O365930 Maternal care for other known or suspected poor fetal growth, third trimester, not applicable or unspecified: Secondary | ICD-10-CM

## 2022-07-08 DIAGNOSIS — O09523 Supervision of elderly multigravida, third trimester: Secondary | ICD-10-CM | POA: Diagnosis not present

## 2022-07-08 DIAGNOSIS — O24410 Gestational diabetes mellitus in pregnancy, diet controlled: Secondary | ICD-10-CM | POA: Diagnosis not present

## 2022-07-08 DIAGNOSIS — O3507X0 Maternal care for (suspected) central nervous system malformation or damage in fetus, microcephaly, not applicable or unspecified: Secondary | ICD-10-CM

## 2022-07-08 DIAGNOSIS — Z3A35 35 weeks gestation of pregnancy: Secondary | ICD-10-CM

## 2022-07-08 NOTE — Procedures (Signed)
Heather Charles 30-Oct-1986 [redacted]w[redacted]d Fetus A Non-Stress Test Interpretation for 07/08/22  Indication: IUGR  Fetal Heart Rate A Mode: External Baseline Rate (A): 145 bpm Variability: Moderate Accelerations: 15 x 15 Decelerations: None Multiple birth?: No  Uterine Activity Mode: Palpation, Toco Contraction Frequency (min): none Resting Tone Palpated: Relaxed  Interpretation (Fetal Testing) Nonstress Test Interpretation: Reactive Overall Impression: Reassuring for gestational age Comments: Dr. SDonalee Citrinreviewed tracing.

## 2022-07-13 ENCOUNTER — Telehealth (HOSPITAL_COMMUNITY): Payer: Self-pay | Admitting: *Deleted

## 2022-07-13 ENCOUNTER — Encounter (HOSPITAL_COMMUNITY): Payer: Self-pay | Admitting: *Deleted

## 2022-07-13 NOTE — Telephone Encounter (Signed)
Preadmission screen  

## 2022-07-14 ENCOUNTER — Encounter (HOSPITAL_COMMUNITY): Payer: Self-pay

## 2022-07-16 ENCOUNTER — Ambulatory Visit: Payer: PRIVATE HEALTH INSURANCE

## 2022-07-16 ENCOUNTER — Other Ambulatory Visit: Payer: Self-pay | Admitting: Obstetrics and Gynecology

## 2022-07-16 ENCOUNTER — Encounter (HOSPITAL_COMMUNITY): Payer: Self-pay | Admitting: *Deleted

## 2022-07-16 ENCOUNTER — Ambulatory Visit (HOSPITAL_BASED_OUTPATIENT_CLINIC_OR_DEPARTMENT_OTHER): Payer: PRIVATE HEALTH INSURANCE

## 2022-07-16 ENCOUNTER — Ambulatory Visit: Payer: PRIVATE HEALTH INSURANCE | Admitting: *Deleted

## 2022-07-16 ENCOUNTER — Telehealth (HOSPITAL_COMMUNITY): Payer: Self-pay | Admitting: *Deleted

## 2022-07-16 VITALS — BP 107/55 | HR 85

## 2022-07-16 DIAGNOSIS — O365930 Maternal care for other known or suspected poor fetal growth, third trimester, not applicable or unspecified: Secondary | ICD-10-CM

## 2022-07-16 DIAGNOSIS — O365990 Maternal care for other known or suspected poor fetal growth, unspecified trimester, not applicable or unspecified: Secondary | ICD-10-CM

## 2022-07-16 DIAGNOSIS — O24410 Gestational diabetes mellitus in pregnancy, diet controlled: Secondary | ICD-10-CM

## 2022-07-16 DIAGNOSIS — O09523 Supervision of elderly multigravida, third trimester: Secondary | ICD-10-CM

## 2022-07-16 DIAGNOSIS — Z3A37 37 weeks gestation of pregnancy: Secondary | ICD-10-CM

## 2022-07-16 DIAGNOSIS — O3507X0 Maternal care for (suspected) central nervous system malformation or damage in fetus, microcephaly, not applicable or unspecified: Secondary | ICD-10-CM | POA: Diagnosis not present

## 2022-07-16 NOTE — Telephone Encounter (Signed)
Preadmission screen  

## 2022-07-17 ENCOUNTER — Inpatient Hospital Stay (HOSPITAL_COMMUNITY)
Admission: AD | Admit: 2022-07-17 | Discharge: 2022-07-19 | DRG: 807 | Disposition: A | Discharge status: Home / Self Care | Payer: PRIVATE HEALTH INSURANCE | Attending: Obstetrics and Gynecology | Admitting: Obstetrics and Gynecology

## 2022-07-17 ENCOUNTER — Inpatient Hospital Stay (HOSPITAL_COMMUNITY): Payer: PRIVATE HEALTH INSURANCE | Admitting: Anesthesiology

## 2022-07-17 ENCOUNTER — Inpatient Hospital Stay (HOSPITAL_COMMUNITY)
Admission: RE | Admit: 2022-07-17 | Discharge: 2022-07-17 | Disposition: A | Discharge status: Home / Self Care | Payer: PRIVATE HEALTH INSURANCE | Source: Ambulatory Visit | Attending: Obstetrics and Gynecology | Admitting: Obstetrics and Gynecology

## 2022-07-17 ENCOUNTER — Encounter (HOSPITAL_COMMUNITY): Payer: Self-pay | Admitting: Obstetrics and Gynecology

## 2022-07-17 ENCOUNTER — Other Ambulatory Visit: Payer: Self-pay

## 2022-07-17 DIAGNOSIS — O24420 Gestational diabetes mellitus in childbirth, diet controlled: Secondary | ICD-10-CM | POA: Diagnosis present

## 2022-07-17 DIAGNOSIS — Z87891 Personal history of nicotine dependence: Secondary | ICD-10-CM

## 2022-07-17 DIAGNOSIS — O365930 Maternal care for other known or suspected poor fetal growth, third trimester, not applicable or unspecified: Secondary | ICD-10-CM | POA: Diagnosis present

## 2022-07-17 DIAGNOSIS — Z3A37 37 weeks gestation of pregnancy: Secondary | ICD-10-CM | POA: Diagnosis not present

## 2022-07-17 DIAGNOSIS — O365990 Maternal care for other known or suspected poor fetal growth, unspecified trimester, not applicable or unspecified: Principal | ICD-10-CM

## 2022-07-17 HISTORY — DX: Type 2 diabetes mellitus without complications: E11.9

## 2022-07-17 LAB — CBC
HCT: 36.7 % (ref 36.0–46.0)
Hemoglobin: 12.5 g/dL (ref 12.0–15.0)
MCH: 31.1 pg (ref 26.0–34.0)
MCHC: 34.1 g/dL (ref 30.0–36.0)
MCV: 91.3 fL (ref 80.0–100.0)
Platelets: 175 10*3/uL (ref 150–400)
RBC: 4.02 MIL/uL (ref 3.87–5.11)
RDW: 13.5 % (ref 11.5–15.5)
WBC: 10 10*3/uL (ref 4.0–10.5)
nRBC: 0 % (ref 0.0–0.2)

## 2022-07-17 LAB — TYPE AND SCREEN
ABO/RH(D): O NEG
Antibody Screen: POSITIVE

## 2022-07-17 LAB — GLUCOSE, CAPILLARY
Glucose-Capillary: 78 mg/dL (ref 70–99)
Glucose-Capillary: 63 mg/dL — ABNORMAL LOW (ref 70–99)

## 2022-07-17 LAB — OB RESULTS CONSOLE GBS: GBS: NEGATIVE

## 2022-07-17 LAB — RPR: RPR Ser Ql: NONREACTIVE

## 2022-07-17 MED ORDER — BENZOCAINE-MENTHOL 20-0.5 % EX AERO
1.0000 | INHALATION_SPRAY | CUTANEOUS | Status: DC | PRN
Start: 2022-07-17 — End: 2022-07-19
  Administered 2022-07-18: 1 via TOPICAL
  Filled 2022-07-17: qty 56

## 2022-07-17 MED ORDER — ONDANSETRON HCL 4 MG PO TABS: 4.0000 mg | ORAL_TABLET | ORAL | Status: DC | PRN

## 2022-07-17 MED ORDER — SENNOSIDES-DOCUSATE SODIUM 8.6-50 MG PO TABS
2.0000 | ORAL_TABLET | Freq: Every day | ORAL | Status: DC
  Administered 2022-07-18 – 2022-07-19 (×2): 2 via ORAL
  Filled 2022-07-17 (×2): qty 2

## 2022-07-17 MED ORDER — EPHEDRINE 5 MG/ML INJ: 10.0000 mg | INTRAVENOUS | Status: DC | PRN

## 2022-07-17 MED ORDER — LACTATED RINGERS IV SOLN: 500.0000 mL | INTRAVENOUS | Status: DC | PRN

## 2022-07-17 MED ORDER — ACETAMINOPHEN 325 MG PO TABS
650.0000 mg | ORAL_TABLET | ORAL | Status: DC | PRN
  Administered 2022-07-18 (×3): 650 mg via ORAL
  Filled 2022-07-17 (×3): qty 2

## 2022-07-17 MED ORDER — OXYCODONE-ACETAMINOPHEN 5-325 MG PO TABS: 2.0000 | ORAL_TABLET | ORAL | Status: DC | PRN

## 2022-07-17 MED ORDER — LIDOCAINE-EPINEPHRINE (PF) 1.5 %-1:200000 IJ SOLN
INTRAMUSCULAR | Status: DC | PRN
  Administered 2022-07-17: 5 mL via EPIDURAL

## 2022-07-17 MED ORDER — PHENYLEPHRINE 80 MCG/ML (10ML) SYRINGE FOR IV PUSH (FOR BLOOD PRESSURE SUPPORT): 80.0000 ug | PREFILLED_SYRINGE | INTRAVENOUS | Status: DC | PRN

## 2022-07-17 MED ORDER — IBUPROFEN 600 MG PO TABS
600.0000 mg | ORAL_TABLET | Freq: Four times a day (QID) | ORAL | Status: DC
  Administered 2022-07-18 – 2022-07-19 (×8): 600 mg via ORAL
  Filled 2022-07-17 (×8): qty 1

## 2022-07-17 MED ORDER — DIPHENHYDRAMINE HCL 25 MG PO CAPS: 25.0000 mg | ORAL_CAPSULE | Freq: Four times a day (QID) | ORAL | Status: DC | PRN

## 2022-07-17 MED ORDER — WITCH HAZEL-GLYCERIN EX PADS: 1.0000 | MEDICATED_PAD | CUTANEOUS | Status: DC | PRN

## 2022-07-17 MED ORDER — ZOLPIDEM TARTRATE 5 MG PO TABS: 5.0000 mg | ORAL_TABLET | Freq: Every evening | ORAL | Status: DC | PRN

## 2022-07-17 MED ORDER — ACETAMINOPHEN 325 MG PO TABS
650.0000 mg | ORAL_TABLET | ORAL | Status: DC | PRN
Start: 2022-07-17 — End: 2022-07-17

## 2022-07-17 MED ORDER — FENTANYL-BUPIVACAINE-NACL 0.5-0.125-0.9 MG/250ML-% EP SOLN
12.0000 mL/h | EPIDURAL | Status: DC | PRN
  Administered 2022-07-17: 12 mL/h via EPIDURAL
  Filled 2022-07-17: qty 250

## 2022-07-17 MED ORDER — COCONUT OIL OIL: 1.0000 | TOPICAL_OIL | Status: DC | PRN

## 2022-07-17 MED ORDER — LACTATED RINGERS IV SOLN: INTRAVENOUS | Status: DC

## 2022-07-17 MED ORDER — TETANUS-DIPHTH-ACELL PERTUSSIS 5-2.5-18.5 LF-MCG/0.5 IM SUSY: 0.5000 mL | PREFILLED_SYRINGE | Freq: Once | INTRAMUSCULAR | Status: DC

## 2022-07-17 MED ORDER — OXYTOCIN BOLUS FROM INFUSION
333.0000 mL | Freq: Once | INTRAVENOUS | Status: AC
Start: 2022-07-17 — End: 2022-07-17
  Administered 2022-07-17: 333 mL via INTRAVENOUS

## 2022-07-17 MED ORDER — TERBUTALINE SULFATE 1 MG/ML IJ SOLN
0.2500 mg | Freq: Once | INTRAMUSCULAR | Status: DC | PRN
Start: 2022-07-17 — End: 2022-07-17

## 2022-07-17 MED ORDER — OXYTOCIN-SODIUM CHLORIDE 30-0.9 UT/500ML-% IV SOLN
1.0000 m[IU]/min | INTRAVENOUS | Status: DC
  Administered 2022-07-17: 2 m[IU]/min via INTRAVENOUS
  Filled 2022-07-17: qty 500

## 2022-07-17 MED ORDER — LIDOCAINE HCL (PF) 1 % IJ SOLN
30.0000 mL | INTRAMUSCULAR | Status: DC | PRN
Start: 2022-07-17 — End: 2022-07-17

## 2022-07-17 MED ORDER — PRENATAL MULTIVITAMIN CH
1.0000 | ORAL_TABLET | Freq: Every day | ORAL | Status: DC
  Administered 2022-07-18 – 2022-07-19 (×2): 1 via ORAL
  Filled 2022-07-17 (×2): qty 1

## 2022-07-17 MED ORDER — DIPHENHYDRAMINE HCL 50 MG/ML IJ SOLN: 12.5000 mg | INTRAMUSCULAR | Status: DC | PRN

## 2022-07-17 MED ORDER — OXYTOCIN-SODIUM CHLORIDE 30-0.9 UT/500ML-% IV SOLN: 2.5000 [IU]/h | INTRAVENOUS | Status: DC

## 2022-07-17 MED ORDER — ONDANSETRON HCL 4 MG/2ML IJ SOLN
4.0000 mg | Freq: Four times a day (QID) | INTRAMUSCULAR | Status: DC | PRN
Start: 2022-07-17 — End: 2022-07-17
  Administered 2022-07-17: 4 mg via INTRAVENOUS
  Filled 2022-07-17: qty 2

## 2022-07-17 MED ORDER — LACTATED RINGERS IV SOLN: 500.0000 mL | Freq: Once | INTRAVENOUS | Status: DC

## 2022-07-17 MED ORDER — SIMETHICONE 80 MG PO CHEW: 80.0000 mg | CHEWABLE_TABLET | ORAL | Status: DC | PRN

## 2022-07-17 MED ORDER — DIBUCAINE (PERIANAL) 1 % EX OINT: 1.0000 | TOPICAL_OINTMENT | CUTANEOUS | Status: DC | PRN

## 2022-07-17 MED ORDER — OXYCODONE-ACETAMINOPHEN 5-325 MG PO TABS: 1.0000 | ORAL_TABLET | ORAL | Status: DC | PRN

## 2022-07-17 MED ORDER — SOD CITRATE-CITRIC ACID 500-334 MG/5ML PO SOLN
30.0000 mL | ORAL | Status: DC | PRN
Start: 2022-07-17 — End: 2022-07-17

## 2022-07-17 MED ORDER — ONDANSETRON HCL 4 MG/2ML IJ SOLN: 4.0000 mg | INTRAMUSCULAR | Status: DC | PRN

## 2022-07-17 NOTE — Consult Note (Signed)
Neonatology Note:   Attendance at Delivery:    I was asked by Dr. Lennox Solders to attend this NSVD at term for suspected IUGR. The mother is a 36yo G3P2012 GBS neg, GDM diet controlled. ROM 2h 57mprior to delivery, fluid clear. Very strong THC smell in LDR.  Infant vigorous with good spontaneous cry and tone. +60 sec DCC  done.  Needed minimal bulb suctioning.   Lungs clear to ausc, good tone and pink in DR.  Baby weighed: 2280g.  +void.  Apgars 8 at 1 minute, 9 at 5 minutes.  Family updated.  To MBU in care of Pediatrician.    DMonia SabalEKatherina Mires MD

## 2022-07-17 NOTE — Lactation Note (Signed)
This note was copied from a baby's chart. Lactation Consultation Note  Patient Name: Heather Charles SELTR'V Date: 07/17/2022 Reason for consult: L&D Initial assessment;1st time breastfeeding Age:35 hours   BAby latches but then tongue thrust the nipple from the mouth.  Baby had repeated attempts but slipped onto the nipple after latching.  Finger fed 2 ml of EBM that was collected into a spoon.    Encouraged STS, feed with cues.  Discussed babys gestation and wt. With mom and how early pumping would help stimulate supply and collect milk to feed back to baby.   Lactation to follow up on MBU.  Maternal Data Has patient been taught Hand Expression?: Yes Does the patient have breastfeeding experience prior to this delivery?: No  Feeding Mother's Current Feeding Choice: Breast Milk  LATCH Score Latch: Repeated attempts needed to sustain latch, nipple held in mouth throughout feeding, stimulation needed to elicit sucking reflex.  Audible Swallowing: A few with stimulation  Type of Nipple: Everted at rest and after stimulation (shorter shaft)  Comfort (Breast/Nipple): Soft / non-tender  Hold (Positioning): Assistance needed to correctly position infant at breast and maintain latch.  LATCH Score: 7   Lactation Tools Discussed/Used    Interventions Interventions: Breast feeding basics reviewed;Skin to skin;Assisted with latch;Position options;Support pillows;Education  Discharge    Consult Status Consult Status: Follow-up from L&D    [place] 07/17/2022, 8:15 PM

## 2022-07-17 NOTE — H&P (Addendum)
Heather Charles is a 36 y.o. female presenting for scheduled IOL. +FM, denies VB, LOF, CTX.  PNC c/b 1) severe IUGR: <1st %itle at 25wks, most recent GS with MFM on 8/25: EFW <1%, AC<1% HC -2 and -3 SD, UA dopplers nl, nl afi, BPP 8/8 2( H/o cervical dysplasia - LEEP 2003, SVD in '08 wihtout issue 3) AMA  - low risk NIPT 4) GDM - diet-controlled Weekly AFT reassuring, GBS neg  OB History     Gravida  3   Para  1   Term  1   Preterm      AB  1   Living  1      SAB      IAB  1   Ectopic      Multiple      Live Births  1          Past Medical History:  Diagnosis Date   Cancer ([place])    cevical laser surgery   Diabetes mellitus without complication ([place])    GDM Diet Controlled   Headache    Vaginal Pap smear, abnormal    Past Surgical History:  Procedure Laterality Date   LEEP     WISDOM TOOTH EXTRACTION     Family History: family history includes Asthma in her sister and son; Breast cancer in her paternal grandmother; Cancer in her paternal grandfather and paternal grandmother; Depression in her father; Hypertension in her maternal grandfather and paternal grandmother; Other in her mother; Post-traumatic stress disorder in her father; Stroke in her paternal grandfather. Social History:  reports that she quit smoking about 19 months ago. Her smoking use included cigarettes. She has never used smokeless tobacco. She reports that she does not currently use alcohol after a past usage of about 1.0 standard drink of alcohol per week. She reports that she does not use drugs.     Maternal Diabetes: Yes:  Diabetes Type:  Diet controlled Genetic Screening: Normal Maternal Ultrasounds/Referrals: IUGR Fetal Ultrasounds or other Referrals:  Referred to Materal Fetal Medicine  Maternal Substance Abuse:  No Significant Maternal Medications:  None Significant Maternal Lab Results:  Group B Strep negative Number of Prenatal Visits:greater than 3 verified prenatal  visits Other Comments:  None  Review of Systems  Constitutional:  Negative for chills and fever.  Respiratory:  Negative for shortness of breath.   Cardiovascular:  Negative for chest pain, palpitations and leg swelling.  Gastrointestinal:  Negative for abdominal pain and vomiting.  Neurological:  Negative for dizziness, weakness and headaches.  Psychiatric/Behavioral:  Negative for suicidal ideas.    Maternal Medical History:  Contractions: Onset was less than 1 hour ago.   Fetal activity: Perceived fetal activity is normal.   Prenatal complications: IUGR.   No PIH, oligohydramnios, placental abnormality or polyhydramnios.   Prenatal Complications - Diabetes: gestational. Diabetes is managed by diet.       Temperature 98.1 F (36.7 C), temperature source Oral, height 5' (1.524 m), weight 76.4 kg, last menstrual period 11/05/2021. Exam Physical Exam Constitutional:      General: She is not in acute distress.    Appearance: She is well-developed.  HENT:     Head: Normocephalic and atraumatic.  Eyes:     Pupils: Pupils are equal, round, and reactive to light.  Cardiovascular:     Rate and Rhythm: Normal rate and regular rhythm.     Heart sounds: No murmur heard.    No gallop.  Abdominal:  Tenderness: There is no abdominal tenderness. There is no guarding or rebound.  Genitourinary:    Vagina: Normal.  Musculoskeletal:        General: Normal range of motion.     Cervical back: Normal range of motion and neck supple.  Skin:    General: Skin is warm and dry.  Neurological:     Mental Status: She is alert and oriented to person, place, and time.     Prenatal labs: ABO, Rh: O/Negative/-- (02/28 0000) Antibody: Negative (02/28 0000) Rubella: Immune (02/28 0000) RPR: Nonreactive (02/28 0000)  HBsAg: Negative (02/28 0000)  HIV: Non-reactive (02/28 0000)  GBS: Negative/-- (08/26 0000)   Assessment/Plan: This is a 36yo G3P1011 @ 85 1/7 by 9wk TVUS admitted for IOL  for severe IUGR with normal dopplers. GBS neg, pelvis proven to 6lb7oz. GBS neg, may have epidural upon request. Plan for AROM and pitocin. Anticipate SVD  Cat 1 tracing, TOCO q4-67mCE 1/50/-3 SMelida QuitterShivaji 07/17/2022, 8:04 AM

## 2022-07-17 NOTE — Anesthesia Procedure Notes (Signed)
Epidural Patient location during procedure: OB Start time: 07/17/2022 3:03 PM End time: 07/17/2022 3:12 PM  Staffing Anesthesiologist: Darral Dash, DO Performed: anesthesiologist   Preanesthetic Checklist Completed: patient identified, IV checked, site marked, risks and benefits discussed, surgical consent, monitors and equipment checked, pre-op evaluation and timeout performed  Epidural Patient position: sitting Prep: ChloraPrep Patient monitoring: heart rate, continuous pulse ox and blood pressure Approach: midline Location: L3-L4 Injection technique: LOR saline  Needle:  Needle type: Tuohy  Needle gauge: 17 G Needle length: 9 cm Needle insertion depth: 6 cm Catheter type: closed end flexible Catheter size: 20 Guage Catheter at skin depth: 11 cm Test dose: negative and 1.5% lidocaine with Epi 1:200 K  Assessment Events: blood not aspirated, injection not painful, no injection resistance and no paresthesia  Additional Notes Patient identified. Risks/Benefits/Options discussed with patient including but not limited to bleeding, infection, nerve damage, paralysis, failed block, incomplete pain control, headache, blood pressure changes, nausea, vomiting, reactions to medications, itching and postpartum back pain. Confirmed with bedside nurse the patient's most recent platelet count. Confirmed with patient that they are not currently taking any anticoagulation, have any bleeding history or any family history of bleeding disorders. Patient expressed understanding and wished to proceed. All questions were answered. Sterile technique was used throughout the entire procedure. Please see nursing notes for vital signs. Test dose was given through epidural catheter and negative prior to continuing to dose epidural or start infusion. Warning signs of high block given to the patient including shortness of breath, tingling/numbness in hands, complete motor block, or any concerning  symptoms with instructions to call for help. Patient was given instructions on fall risk and not to get out of bed. All questions and concerns addressed with instructions to call with any issues or inadequate analgesia.    Reason for block:procedure for pain

## 2022-07-17 NOTE — Progress Notes (Signed)
Cat 1 tracing, TOCO q3-59m pitocin at 12 mU/min Some painful contractions, otherwise no complaints BP 103/61   Pulse 72   Temp 98.1 F (36.7 C) (Oral)   Ht 5' (1.524 m)   Wt 76.4 kg   LMP 11/05/2021   BMI 32.89 kg/m  CBG q4hrs latent labor, q2 active

## 2022-07-17 NOTE — Progress Notes (Signed)
S/p epidural, comfortable BP 102/72   Pulse 78   Temp 98.1 F (36.7 C) (Oral)   Ht 5' (1.524 m)   Wt 76.4 kg   LMP 11/05/2021   SpO2 99%   BMI 32.89 kg/m  CE 4/70/-2, fetal hair palpable, scant fluid return Pitocin at  20 mU/min, TOCO q3-32mCBG 78  Anticipate SVD

## 2022-07-17 NOTE — Anesthesia Preprocedure Evaluation (Signed)
Anesthesia Evaluation  Patient identified by MRN, date of birth, ID band Patient awake    Reviewed: Allergy & Precautions, NPO status , Patient's Chart, lab work & pertinent test results  Airway Mallampati: II  TM Distance: >3 FB Neck ROM: Full    Dental no notable dental hx.    Pulmonary neg pulmonary ROS, former smoker,    Pulmonary exam normal        Cardiovascular negative cardio ROS   Rhythm:Regular Rate:Normal     Neuro/Psych  Headaches, negative psych ROS   GI/Hepatic negative GI ROS, Neg liver ROS,   Endo/Other  diabetes, Gestational  Renal/GU negative Renal ROS  negative genitourinary   Musculoskeletal negative musculoskeletal ROS (+)   Abdominal Normal abdominal exam  (+)   Peds  Hematology negative hematology ROS (+)   Anesthesia Other Findings   Reproductive/Obstetrics (+) Pregnancy                             Anesthesia Physical Anesthesia Plan  ASA: 2  Anesthesia Plan: Epidural   Post-op Pain Management:    Induction:   PONV Risk Score and Plan: 2 and Treatment may vary due to age or medical condition  Airway Management Planned: Natural Airway  Additional Equipment: None  Intra-op Plan:   Post-operative Plan:   Informed Consent: I have reviewed the patients History and Physical, chart, labs and discussed the procedure including the risks, benefits and alternatives for the proposed anesthesia with the patient or authorized representative who has indicated his/her understanding and acceptance.     Dental advisory given  Plan Discussed with:   Anesthesia Plan Comments: (Lab Results      Component                Value               Date                      WBC                      10.0                07/17/2022                HGB                      12.5                07/17/2022                HCT                      36.7                07/17/2022                 MCV                      91.3                07/17/2022                PLT                      175  07/17/2022          )        Anesthesia Quick Evaluation

## 2022-07-18 LAB — CBC
HCT: 34.7 % — ABNORMAL LOW (ref 36.0–46.0)
Hemoglobin: 11.9 g/dL — ABNORMAL LOW (ref 12.0–15.0)
MCH: 31.6 pg (ref 26.0–34.0)
MCHC: 34.3 g/dL (ref 30.0–36.0)
MCV: 92.3 fL (ref 80.0–100.0)
Platelets: 165 10*3/uL (ref 150–400)
RBC: 3.76 MIL/uL — ABNORMAL LOW (ref 3.87–5.11)
RDW: 13.8 % (ref 11.5–15.5)
WBC: 15.2 10*3/uL — ABNORMAL HIGH (ref 4.0–10.5)
nRBC: 0 % (ref 0.0–0.2)

## 2022-07-18 MED ORDER — RHO D IMMUNE GLOBULIN 1500 UNIT/2ML IJ SOSY
300.0000 ug | PREFILLED_SYRINGE | Freq: Once | INTRAMUSCULAR | Status: AC
  Administered 2022-07-18: 300 ug via INTRAVENOUS
  Filled 2022-07-18: qty 2

## 2022-07-18 NOTE — Lactation Note (Signed)
This note was copied from a baby's chart. Lactation Consultation Note  Patient Name: Girl Alnita Aybar [id number] Date: 07/18/2022 Reason for consult: Initial assessment;Early term 37-38.6wks;1st time breastfeeding Age:35 hours   P2: Early term infant at 37+1 weeks Feeding preference:  Breast/formula (Supplementation until baby is able to exclusively breast feed)  Spoke with RN prior to visiting.  She informed me that birth parent was feeding baby.  When I arrived, "[place]" was STS on her chest.  Birth parent attempted latching but was unsuccessful.  She supplemented with 20 mls of formula using the purple extra slow flow nipple.  Swaddled baby for birth parent.  Reviewed the late preterm policy with birth parent.  Offered to return at the next feeding for latch assistance.  Birth parent interested and will call.  Assisted with the electric pump; reviewed set up, parts assembly and cleaning.  #24 flanges are appropriate at this time.  Reviewed milk collection and storage.Wash station set up.  Support person present but remained asleep during the entire visit.  Rn updated.   Maternal Data Has patient been taught Hand Expression?: Yes Does the patient have breastfeeding experience prior to this delivery?: No  Feeding Mother's Current Feeding Choice: Breast Milk and Formula Nipple Type: Extra Slow Flow  LATCH Score                    Lactation Tools Discussed/Used Tools: Pump;Flanges Flange Size: 24 Breast pump type: Double-Electric Breast Pump;Manual Pump Education: Setup, frequency, and cleaning;Milk Storage Reason for Pumping: Breast stimulation for supplementation Pumping frequency: Every three hours  Interventions Interventions: Breast feeding basics reviewed;Education;LC Services brochure;LPT handout/interventions  Discharge Pump: Personal (Spectra)  Consult Status Consult Status: Follow-up Date: 07/19/22 Follow-up type: In-patient    Little Ishikawa 07/18/2022, 4:49 AM

## 2022-07-18 NOTE — Anesthesia Postprocedure Evaluation (Signed)
Anesthesia Post Note  Patient: Heather Charles  Procedure(s) Performed: AN AD HOC LABOR EPIDURAL     Patient location during evaluation: Mother Baby Anesthesia Type: Epidural Level of consciousness: awake and alert and oriented Pain management: satisfactory to patient Vital Signs Assessment: post-procedure vital signs reviewed and stable Respiratory status: respiratory function stable Cardiovascular status: stable Postop Assessment: no headache, no backache, epidural receding, patient able to bend at knees, no signs of nausea or vomiting, adequate PO intake and able to ambulate Anesthetic complications: no   No notable events documented.  Last Vitals:  Vitals:   07/18/22 0601 07/18/22 0745  BP: (!) 88/66 99/61  Pulse: 61 65  Resp: 18 18  Temp: 36.7 C 36.9 C  SpO2:  98%    Last Pain:  Vitals:   07/18/22 0745  TempSrc: Oral  PainSc:    Pain Goal:                   Draylen Lobue

## 2022-07-18 NOTE — Progress Notes (Signed)
POSTPARTUM PROGRESS NOTE  Post Partum Day #1  Subjective:  No acute events overnight.  Pt denies problems with ambulating, voiding or po intake.  She denies nausea or vomiting.  Pain is well controlled.   Lochia Minimal.   Objective: Blood pressure 99/61, pulse 65, temperature 98.5 F (36.9 C), temperature source Oral, resp. rate 18, height 5' (1.524 m), weight 76.4 kg, last menstrual period 11/05/2021, SpO2 98 %, unknown if currently breastfeeding.  Physical Exam:  General: alert, cooperative and no distress Lochia:normal flow Chest: CTAB Heart: RRR no m/r/g Abdomen: +BS, soft, nontender Uterine Fundus: firm, 2cm below umbilicus Extremities: neg edema, neg calf TTP BL, neg Homans BL  Recent Labs    07/17/22 0756 07/18/22 0423  HGB 12.5 11.9*  HCT 36.7 34.7*    Assessment/Plan:  ASSESSMENT: Heather Charles is a 36 y.o. T5V2023 s/p SVD @ [redacted]w[redacted]d PNC c/b severe IUGR <1%tile, GDMA1.   Plan for discharge tomorrow and Lactation consult Supplementing with Neosure   LOS: 1 day

## 2022-07-19 LAB — RH IG WORKUP (INCLUDES ABO/RH)
Fetal Screen: NEGATIVE
Gestational Age(Wks): 37.1
Unit division: 0

## 2022-07-19 MED ORDER — ACETAMINOPHEN 325 MG PO TABS: 650.0000 mg | ORAL_TABLET | ORAL | 1 refills | Status: AC | PRN

## 2022-07-19 MED ORDER — IBUPROFEN 600 MG PO TABS: 600.0000 mg | ORAL_TABLET | Freq: Four times a day (QID) | ORAL | 0 refills | Status: AC

## 2022-07-19 NOTE — Progress Notes (Signed)
Post Partum Day 2 Subjective: no complaints, up ad lib, and tolerating PO  Working on feeds and supplementing with neosure  Objective: Blood pressure 95/76, pulse 66, temperature 97.8 F (36.6 C), temperature source Oral, resp. rate 18, height 5' (1.524 m), weight 76.4 kg, last menstrual period 11/05/2021, SpO2 99 %, unknown if currently breastfeeding.  Physical Exam:  General: alert and cooperative Lochia: appropriate Uterine Fundus: firm   Recent Labs    07/17/22 0756 07/18/22 0423  HGB 12.5 11.9*  HCT 36.7 34.7*    Assessment/Plan: Discharge home, peds to determine if baby ready or needs to room in F/u office for routine pp   LOS: 2 days   Logan Bores, MD 07/19/2022, 10:47 AM

## 2022-07-19 NOTE — Lactation Note (Signed)
This note was copied from a baby's chart. Lactation Consultation Note  Patient Name: Heather Charles [id number] Date: 07/19/2022 Reason for consult: Follow-up assessment;Early term 37-38.6wks;Infant < 6lbs;1st time breastfeeding Age:35 hours  P1, [redacted]w[redacted]d  < 5 lbs. Mother wanted assistance with latching.  Attempted and mother stated baby would not sustain  latch.  Applied #20NS and baby was able to sustain latch. Prefilled with formula. Mother has been pumping but did not pump last night due to exhaustion.  In order to get a minimum of 8 pumping sessions in a day suggest pumping 2.5  hours during the day and q 4 hours at night to allow more sleep. Recommend Lactation OP appt.  [facility]. Reviewed engorgement care and monitoring voids/stools.   Maternal Data Has patient been taught Hand Expression?: Yes Does the patient have breastfeeding experience prior to this delivery?: No  Feeding Mother's Current Feeding Choice: Breast Milk and Formula Nipple Type: Extra Slow Flow  LATCH Score Latch: Repeated attempts needed to sustain latch, nipple held in mouth throughout feeding, stimulation needed to elicit sucking reflex.  Audible Swallowing: A few with stimulation  Type of Nipple: Everted at rest and after stimulation  Comfort (Breast/Nipple): Soft / non-tender  Hold (Positioning): Assistance needed to correctly position infant at breast and maintain latch.  LATCH Score: 7   Lactation Tools Discussed/Used Tools: Nipple Shields Nipple shield size: 20 Breast pump type: Double-Electric Breast Pump  Interventions Interventions: Assisted with latch;Hand express;DEBP;Education  Discharge Discharge Education: Engorgement and breast care;Warning signs for feeding baby;Outpatient recommendation Pump: Personal;DEBP;Advised to call insurance company (Materials engineer  Consult Status Consult Status: Complete Date: 07/19/22    [name] [name]8/28/2023, 9:27 AM

## 2022-07-19 NOTE — Discharge Summary (Signed)
Postpartum Discharge Summary       Patient Name: Heather Charles DOB: 08-Aug-1986 MRN: [id number]  Date of admission: 07/17/2022 Delivery date:07/17/2022  Delivering provider: Deliah Boston  Date of discharge: 07/19/2022  Admitting diagnosis: IUGR (intrauterine growth restriction) affecting care of mother [O36.5990] Intrauterine pregnancy: [redacted]w[redacted]d    Secondary diagnosis:  Principal Problem:   IUGR (intrauterine growth restriction) affecting care of mother  Additional problems: A1 Gestational Diabetes     Advanced Maternal Age  Discharge diagnosis: Term Pregnancy Delivered                                              Post partum procedures:rhogam Augmentation: AROM and Pitocin Complications: None  Hospital course: Induction of Labor With Vaginal Delivery   36y.o. yo [id number] 35w1das admitted to the hospital 07/17/2022 for induction of labor.  Indication for induction:  IUGR .  Patient had an uncomplicated labor course as follows: Membrane Rupture Time/Date: 4:31 PM ,07/17/2022   Delivery Method:Vaginal, Spontaneous  Episiotomy: None  Lacerations:  None  Details of delivery can be found in separate delivery note.  Patient had a routine postpartum course. Patient is discharged home 07/19/22.  Newborn Data: Birth date:07/17/2022  Birth time:6:44 PM  Gender:Female  Living status:Living  Apgars:8 ,9  Weight:2268 g     Physical exam  Vitals:   07/18/22 1130 07/18/22 1432 07/18/22 2359 07/19/22 0630  BP: 96/61 1'02/69 94/67 95/76 '$  Pulse: 70 90 75 66  Resp: '18 17 17 18  '$ Temp:  97.9 F (36.6 C) (!) 97.4 F (36.3 C) 97.8 F (36.6 C)  TempSrc:  Oral Oral Oral  SpO2:  99% 98% 99%  Weight:      Height:       General: alert and cooperative Lochia: appropriate Uterine Fundus: firm  Labs: Lab Results  Component Value Date   WBC 15.2 (H) 07/18/2022   HGB 11.9 (L) 07/18/2022   HCT 34.7 (L) 07/18/2022   MCV 92.3 07/18/2022   PLT 165 07/18/2022       No data to  display         EdLesothocore:    07/17/2022    8:45 PM  Edinburgh Postnatal Depression Scale Screening Tool  I have been able to laugh and see the funny side of things. 0  I have looked forward with enjoyment to things. 0  I have blamed myself unnecessarily when things went wrong. 1  I have been anxious or worried for no good reason. 0  I have felt scared or panicky for no good reason. 0  Things have been getting on top of me. 1  I have been so unhappy that I have had difficulty sleeping. 0  I have felt sad or miserable. 1  I have been so unhappy that I have been crying. 0  The thought of harming myself has occurred to me. 0  Edinburgh Postnatal Depression Scale Total 3     After visit meds:  Allergies as of 07/19/2022   No Known Allergies      Medication List     STOP taking these medications    aspirin EC 81 MG tablet       TAKE these medications    acetaminophen 325 MG tablet Commonly known as: Tylenol Take 2 tablets (650 mg total) by mouth every  4 (four) hours as needed (for pain scale < 4).   cetirizine 10 MG tablet Commonly known as: ZYRTEC Take 10 mg by mouth daily.   ibuprofen 600 MG tablet Commonly known as: ADVIL Take 1 tablet (600 mg total) by mouth every 6 (six) hours.   prenatal multivitamin Tabs tablet Take 1 tablet by mouth daily at 12 noon.         Discharge home in stable condition Infant Feeding: Bottle and Breast Infant Disposition:rooming in Discharge instruction: per After Visit Summary and Postpartum booklet. Activity: Advance as tolerated. Pelvic rest for 6 weeks.  Diet: routine diet Future Appointments:No future appointments. Follow up Visit:  Follow-up Information     Shivaji, Melida Quitter, MD. Schedule an appointment as soon as possible for a visit in 6 week(s).   Specialty: Obstetrics and Gynecology Why: routine postpartum Contact information: [address] [place]  [phone]                   Please schedule this patient for a In person postpartum visit in 6 weeks with the following provider: MD.  Delivery mode:  Vaginal, Spontaneous  Anticipated Birth Control:  POPs   07/19/2022 Logan Bores, MD

## 2022-07-20 ENCOUNTER — Ambulatory Visit: Payer: Self-pay

## 2022-07-20 NOTE — Lactation Note (Signed)
This note was copied from a baby's chart. Lactation Consultation Note  Patient Name: Heather Charles [id number] Date: 07/20/2022 Reason for consult: Follow-up assessment;1st time breastfeeding Age:35 hours  Infant slept through consult. Mom has been pumping & BO, but her desire is to feed infant at breast. Mom was provided a nipple shield yesterday, but infant fell asleep quickly at the breast.   Mom reports that her breasts feel "hard." On exam, there is no engorgement and her milk is coming to volume. She pumped with size 21 flanges and obtained 56 mL. Mom was very pleased.   Mom will ask MD about fortifying her EBM at her visit. Parents' questions were answered to her satisfaction. An Nfant Standard nipple was provided as parents did not report gulping with the Enfamil Extra-slow flow nipple. Mom knows how to reach lactation for post-discharge questions.   Maternal Data Does the patient have breastfeeding experience prior to this delivery?: No  Feeding Mother's Current Feeding Choice: Breast Milk and Formula Nipple Type: Extra Slow Flow  Lactation Tools Discussed/Used Tools: Pump;Flanges Flange Size: 21 Breast pump type: Double-Electric Breast Pump Pumping frequency: was pumping q3h until midnight Pumped volume: 56 mL  Interventions Interventions: DEBP;Education  Discharge Discharge Education: Outpatient recommendation;Outpatient Epic message sent;Engorgement and breast care Pump: Personal (has a Spectra pump) WIC Program: Yes  Consult Status Consult Status: Complete    Larkin Ina 07/20/2022, 10:26 AM

## 2022-07-24 ENCOUNTER — Telehealth (HOSPITAL_COMMUNITY): Payer: Self-pay

## 2022-07-24 NOTE — Telephone Encounter (Signed)
Patient reports feeling good. Patient declines questions/concerns about her health and healing.  Patient reports that baby is doing well. Eating, peeing/pooping, and gaining weight well. "She is gaining weight, we have another weight check on Tuesday". Baby sleeps in a bassinet. RN reviewed ABC's of safe sleep with patient. Patient declines any questions or concerns about baby.  EPDS score is 4.  Sharyn Lull [facility]  07/24/22,1206

## 2023-07-05 IMAGING — US US MFM OB LIMITED
1 series · 13 of 28 positions shown · non-contrast
Comparison: none

[Series 1: us mfm ob limited · 37 acquisitions, 13 frames shown]
[im 2/37]
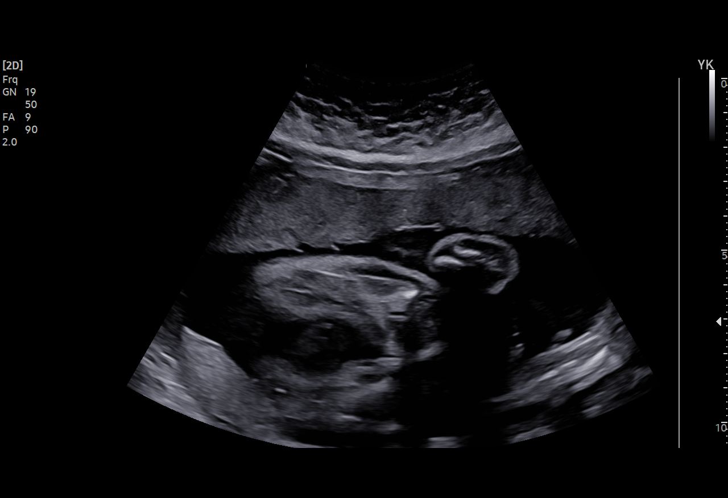
[im 5/37]
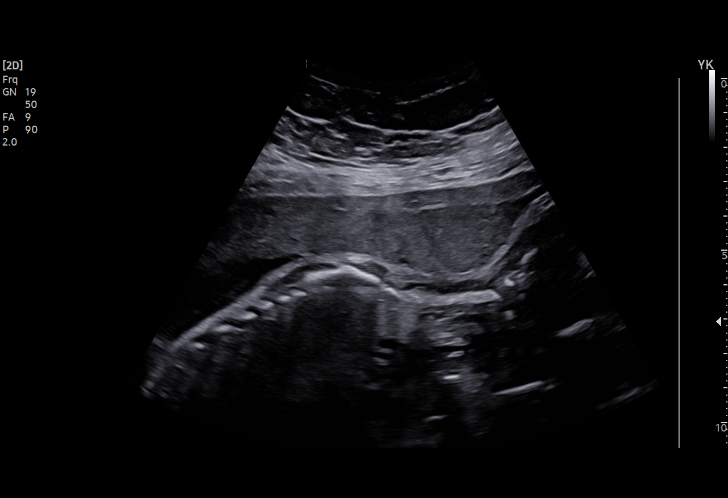
[im 7/37]
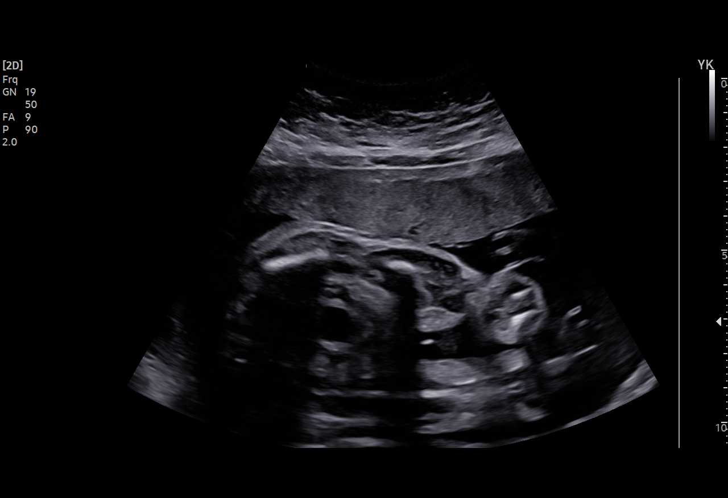
[im 10/37]
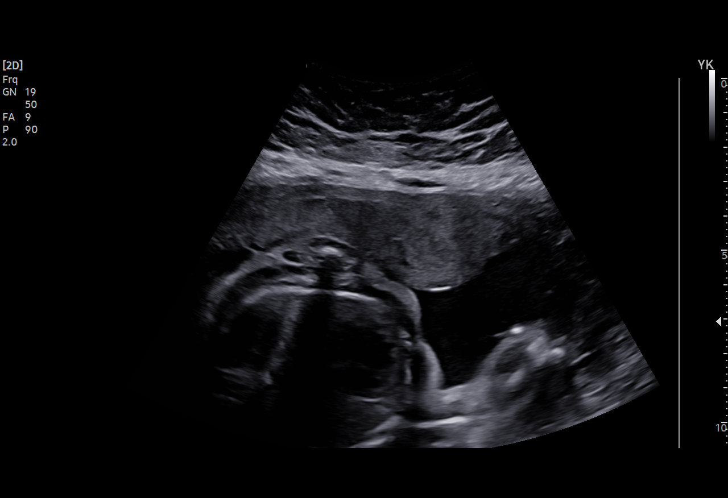
[im 13/37]
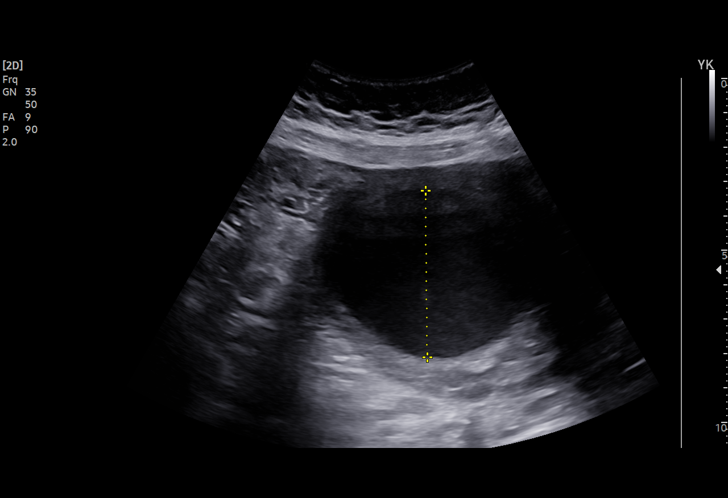
[im 15/37]
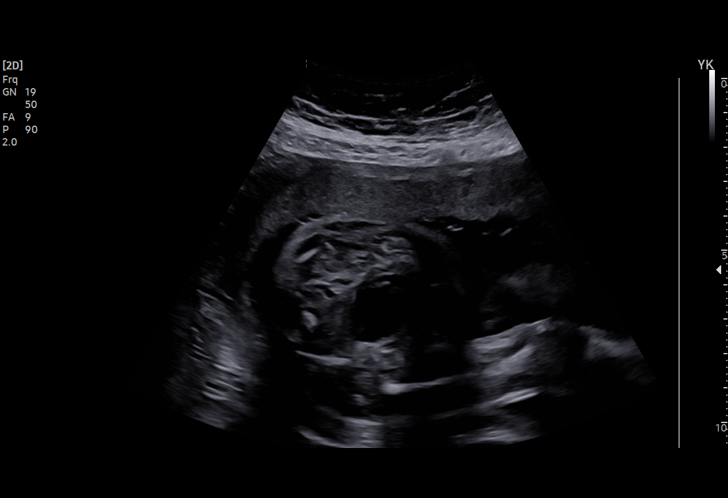
[im 19/37]
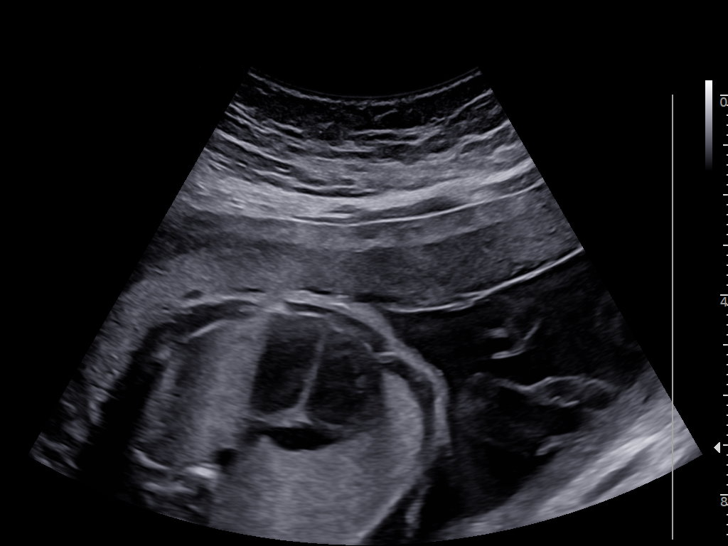
[im 22/37]
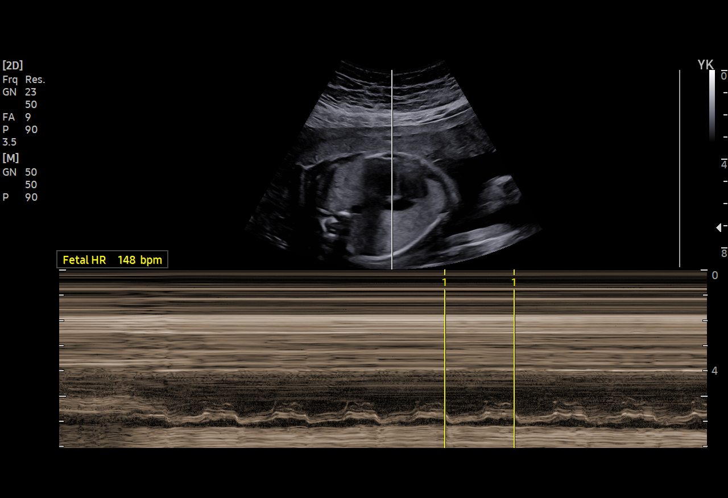
[im 25/37]
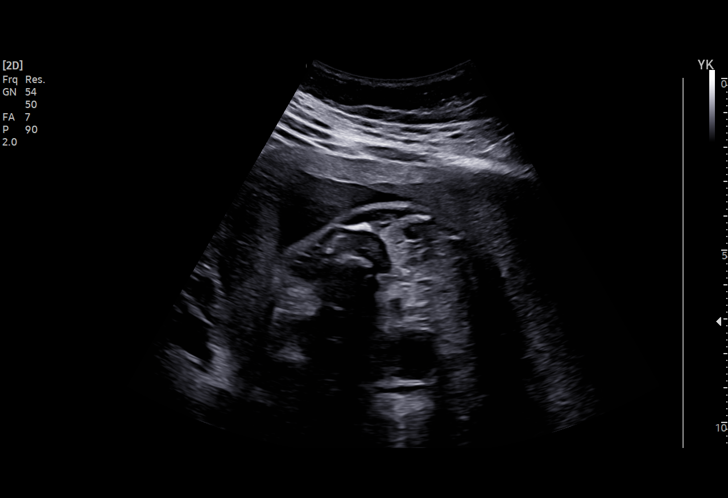
[im 27/37]
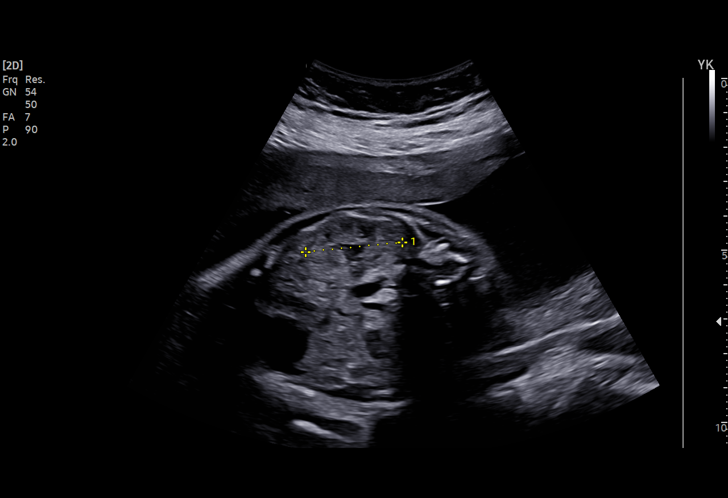
[im 30/37]
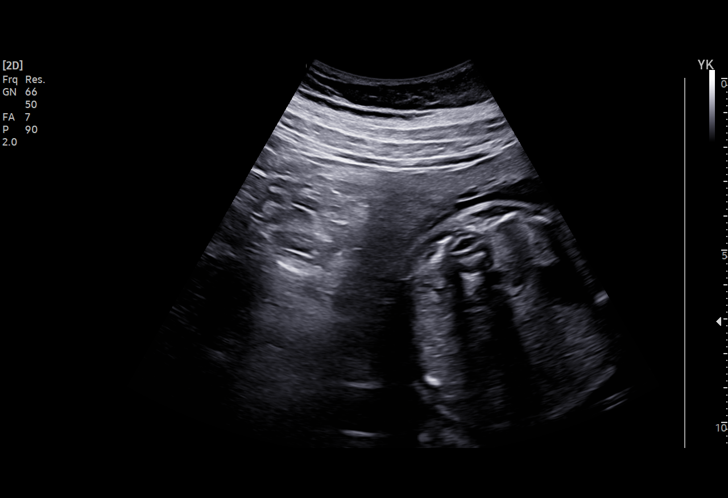
[im 33/37]
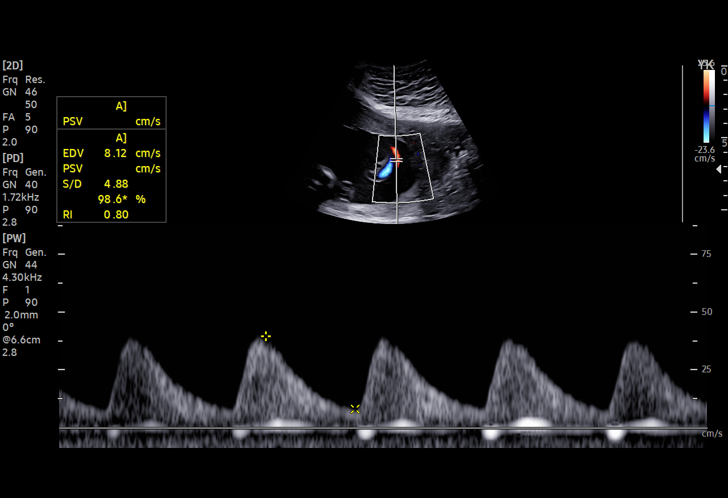
[im 35/37]
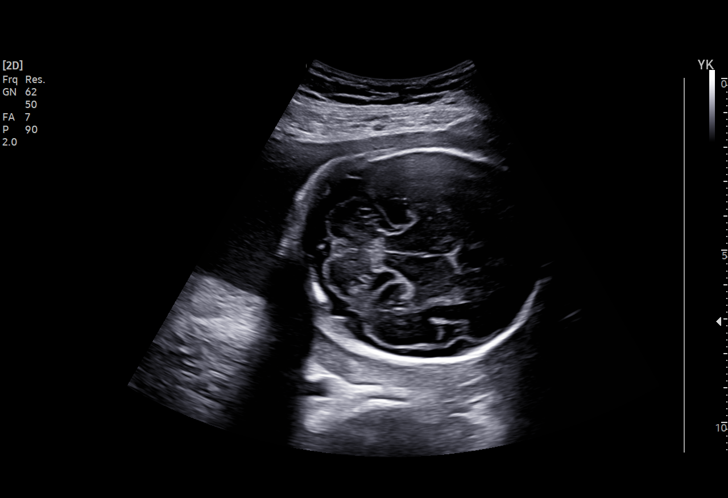

[13 of 28 positions shown; findings below may reference images not displayed]

#101

 1  US MFM UA CORD DOPPLER                76820.02    SIP
                                                      EBADAT
 2  US MFM OB LIMITED                     76815.01    SIP
                                                      EBADAT

Indications

 Advanced maternal age multigravida 35+,
 second trimester
 Encounter for antenatal screening for
 malformations
 NIPS LR female, Horizon negative
 Fetal abnormality - other known or
 suspected (microcephaly)
 Small for gestational age fetus affecting
 management of mother
 27 weeks gestation of pregnancy
Fetal Evaluation

 Num Of Fetuses:         1
 Fetal Heart Rate(bpm):  148
 Cardiac Activity:       Observed
 Presentation:           Cephalic
 Placenta:               Anterior
 P. Cord Insertion:      Not well visualized previously

 Amniotic Fluid
 AFI FV:      Within normal limits

 AFI Sum(cm)     %Tile       Largest Pocket(cm)
 15.83           57
 RUQ(cm)       RLQ(cm)       LUQ(cm)        LLQ(cm)

Biometry

 LV:        3.3  mm
OB History

 Gravidity:    3         Term:   1
 TOP:          1        Living:  1
Gestational Age

 LMP:           26w 1d        Date:  11/05/21                 EDD:   08/12/22
 Best:          27w 0d     Det. By:  Early Ultrasound         EDD:   08/06/22
                                     (01/19/22)
Anatomy

 Cranium:               Appears normal         Aortic Arch:            Previously seen
 Cavum:                 Appears normal         Ductal Arch:            Previously seen
 Ventricles:            Appears normal         Diaphragm:              Previously seen
 Choroid Plexus:        Previously seen        Stomach:                Appears normal, left
                                                                       sided
 Cerebellum:            Previously seen        Abdomen:                Appears normal
 Posterior Fossa:       Previously seen        Abdominal Wall:         Previously seen
 Nuchal Fold:           Not applicable (>20    Cord Vessels:           Previously seen
                        wks GA)
 Face:                  Orbits and profile     Kidneys:                Appear normal
                        previously seen
 Lips:                  Previously seen        Bladder:                Appears normal
 Thoracic:              Appears normal         Spine:                  Previously seen
 Heart:                 Appears normal         Upper Extremities:      Previously seen
                        (4CH, axis, and
                        situs)
 RVOT:                  Previously seen        Lower Extremities:      Previously seen
 LVOT:                  Previously seen

 Other:  Heels/feet and open hands/5th digits previously visualized. Nasal
         bone, lenses, maxilla, mandible and falx previously visualized
Doppler - Fetal Vessels

 Umbilical Artery
  S/D     %tile      RI    %tile                      PSV    ADFV    RDFV
                                                    (cm/s)
  4.77       97    0.[REDACTED]      No      No

Cervix Uterus Adnexa

 Cervix
 Not visualized (advanced GA >50wks)
Impression
 Antenatal testing due to FGR with an EFW 4%
 NST reactive good fetal movement and amniotic fluid volume
 UA Dopplers are elevated with no evidence of AEDF or REDF

 I discussed today's findings with Ms. Erxleben. All questions
 answered.
Recommendations

 Continue weekly testing with NST and UA Dopplers.
 Repeat growth scheduled in 1 week.
